# Patient Record
Sex: Female | Born: 1950 | Race: White | Hispanic: No | Marital: Married | Smoking: Never smoker
Health system: Southern US, Community
[De-identification: ages and names within clinical notes are randomized; demographics above are authoritative.]

## PROBLEM LIST (undated history)

## (undated) DIAGNOSIS — I313 Pericardial effusion (noninflammatory): Secondary | ICD-10-CM

## (undated) DIAGNOSIS — R55 Syncope and collapse: Secondary | ICD-10-CM

## (undated) DIAGNOSIS — I519 Heart disease, unspecified: Secondary | ICD-10-CM

## (undated) DIAGNOSIS — M545 Low back pain, unspecified: Secondary | ICD-10-CM

## (undated) DIAGNOSIS — T7840XA Allergy, unspecified, initial encounter: Secondary | ICD-10-CM

## (undated) DIAGNOSIS — E039 Hypothyroidism, unspecified: Secondary | ICD-10-CM

## (undated) DIAGNOSIS — F32A Depression, unspecified: Secondary | ICD-10-CM

## (undated) DIAGNOSIS — J45909 Unspecified asthma, uncomplicated: Secondary | ICD-10-CM

## (undated) DIAGNOSIS — Z8719 Personal history of other diseases of the digestive system: Secondary | ICD-10-CM

## (undated) DIAGNOSIS — C50919 Malignant neoplasm of unspecified site of unspecified female breast: Secondary | ICD-10-CM

## (undated) DIAGNOSIS — M479 Spondylosis, unspecified: Secondary | ICD-10-CM

## (undated) DIAGNOSIS — M25551 Pain in right hip: Secondary | ICD-10-CM

## (undated) DIAGNOSIS — K219 Gastro-esophageal reflux disease without esophagitis: Secondary | ICD-10-CM

## (undated) DIAGNOSIS — J302 Other seasonal allergic rhinitis: Secondary | ICD-10-CM

## (undated) DIAGNOSIS — F329 Major depressive disorder, single episode, unspecified: Secondary | ICD-10-CM

## (undated) DIAGNOSIS — M1990 Unspecified osteoarthritis, unspecified site: Secondary | ICD-10-CM

## (undated) DIAGNOSIS — Z8711 Personal history of peptic ulcer disease: Secondary | ICD-10-CM

## (undated) DIAGNOSIS — I351 Nonrheumatic aortic (valve) insufficiency: Secondary | ICD-10-CM

## (undated) DIAGNOSIS — F419 Anxiety disorder, unspecified: Secondary | ICD-10-CM

## (undated) DIAGNOSIS — I3139 Other pericardial effusion (noninflammatory): Secondary | ICD-10-CM

## (undated) DIAGNOSIS — R011 Cardiac murmur, unspecified: Secondary | ICD-10-CM

## (undated) DIAGNOSIS — R197 Diarrhea, unspecified: Secondary | ICD-10-CM

## (undated) DIAGNOSIS — K760 Fatty (change of) liver, not elsewhere classified: Secondary | ICD-10-CM

## (undated) DIAGNOSIS — F319 Bipolar disorder, unspecified: Secondary | ICD-10-CM

## (undated) DIAGNOSIS — I341 Nonrheumatic mitral (valve) prolapse: Secondary | ICD-10-CM

## (undated) DIAGNOSIS — R0600 Dyspnea, unspecified: Secondary | ICD-10-CM

## (undated) DIAGNOSIS — G5600 Carpal tunnel syndrome, unspecified upper limb: Secondary | ICD-10-CM

## (undated) DIAGNOSIS — I700 Atherosclerosis of aorta: Secondary | ICD-10-CM

## (undated) DIAGNOSIS — Z9889 Other specified postprocedural states: Secondary | ICD-10-CM

## (undated) DIAGNOSIS — I639 Cerebral infarction, unspecified: Secondary | ICD-10-CM

## (undated) DIAGNOSIS — E538 Deficiency of other specified B group vitamins: Secondary | ICD-10-CM

## (undated) DIAGNOSIS — I5189 Other ill-defined heart diseases: Secondary | ICD-10-CM

## (undated) DIAGNOSIS — R112 Nausea with vomiting, unspecified: Secondary | ICD-10-CM

## (undated) DIAGNOSIS — M4800 Spinal stenosis, site unspecified: Secondary | ICD-10-CM

## (undated) DIAGNOSIS — G4700 Insomnia, unspecified: Secondary | ICD-10-CM

## (undated) DIAGNOSIS — G43909 Migraine, unspecified, not intractable, without status migrainosus: Secondary | ICD-10-CM

## (undated) DIAGNOSIS — I10 Essential (primary) hypertension: Secondary | ICD-10-CM

## (undated) DIAGNOSIS — M5416 Radiculopathy, lumbar region: Secondary | ICD-10-CM

## (undated) HISTORY — PX: UPPER GI ENDOSCOPY: SHX6162

## (undated) HISTORY — PX: CHOLECYSTECTOMY: SHX55

## (undated) HISTORY — PX: COLONOSCOPY: SHX174

## (undated) HISTORY — PX: HIP ARTHROPLASTY: SHX981

## (undated) HISTORY — DX: Allergy, unspecified, initial encounter: T78.40XA

## (undated) HISTORY — PX: UPPER GASTROINTESTINAL ENDOSCOPY: SHX188

## (undated) HISTORY — PX: BREAST LUMPECTOMY: SHX2

## (undated) HISTORY — PX: GASTRIC FUNDOPLICATION: SHX226

## (undated) HISTORY — PX: PARTIAL HYSTERECTOMY: SHX80

---

## 2015-12-10 DIAGNOSIS — M1611 Unilateral primary osteoarthritis, right hip: Secondary | ICD-10-CM | POA: Insufficient documentation

## 2015-12-29 DIAGNOSIS — F329 Major depressive disorder, single episode, unspecified: Secondary | ICD-10-CM | POA: Insufficient documentation

## 2015-12-29 DIAGNOSIS — E079 Disorder of thyroid, unspecified: Secondary | ICD-10-CM | POA: Insufficient documentation

## 2015-12-29 DIAGNOSIS — F32A Depression, unspecified: Secondary | ICD-10-CM | POA: Insufficient documentation

## 2015-12-29 DIAGNOSIS — K449 Diaphragmatic hernia without obstruction or gangrene: Secondary | ICD-10-CM | POA: Insufficient documentation

## 2015-12-29 DIAGNOSIS — J45909 Unspecified asthma, uncomplicated: Secondary | ICD-10-CM | POA: Insufficient documentation

## 2015-12-29 DIAGNOSIS — I10 Essential (primary) hypertension: Secondary | ICD-10-CM | POA: Insufficient documentation

## 2017-08-06 ENCOUNTER — Telehealth: Payer: Self-pay | Admitting: Nurse Practitioner

## 2017-08-06 ENCOUNTER — Other Ambulatory Visit: Payer: Self-pay | Admitting: Orthopedic Surgery

## 2017-08-06 DIAGNOSIS — M545 Low back pain: Principal | ICD-10-CM

## 2017-08-06 DIAGNOSIS — G8929 Other chronic pain: Secondary | ICD-10-CM

## 2017-08-06 NOTE — Telephone Encounter (Signed)
Phone call to patient to verify medication list and allergies for myelogram procedure. Pt instructed to stop adderall and tramadol 48 hrs prior to myelogram appointment time. Pt verbalized understanding.

## 2017-08-15 ENCOUNTER — Other Ambulatory Visit: Payer: Self-pay

## 2017-08-15 ENCOUNTER — Inpatient Hospital Stay
Admission: RE | Admit: 2017-08-15 | Discharge: 2017-08-15 | Disposition: A | Discharge status: Home / Self Care | Payer: Self-pay | Source: Ambulatory Visit | Attending: Orthopedic Surgery | Admitting: Orthopedic Surgery

## 2017-08-15 NOTE — Discharge Instructions (Signed)
Myelogram Discharge Instructions  1. Go home and rest quietly for the next 24 hours.  It is important to lie flat for the next 24 hours.  Get up only to go to the restroom.  You may lie in the bed or on a couch on your back, your stomach, your left side or your right side.  You may have one pillow under your head.  You may have pillows between your knees while you are on your side or under your knees while you are on your back.  2. DO NOT drive today.  Recline the seat as far back as it will go, while still wearing your seat belt, on the way home.  3. You may get up to go to the bathroom as needed.  You may sit up for 10 minutes to eat.  You may resume your normal diet and medications unless otherwise indicated.  Drink lots of extra fluids today and tomorrow.  4. The incidence of headache, nausea, or vomiting is about 5% (one in 20 patients).  If you develop a headache, lie flat and drink plenty of fluids until the headache goes away.  Caffeinated beverages may be helpful.  If you develop severe nausea and vomiting or a headache that does not go away with flat bed rest, call [phone].  5. You may resume normal activities after your 24 hours of bed rest is over; however, do not exert yourself strongly or do any heavy lifting tomorrow. If when you get up you have a headache when standing, go back to bed and force fluids for another 24 hours.  6. Call your physician for a follow-up appointment.  The results of your myelogram will be sent directly to your physician by the following day.  7. If you have any questions or if complications develop after you arrive home, please call [phone].  Discharge instructions have been explained to the patient.  The patient, or the person responsible for the patient, fully understands these instructions.  YOU MAY RESTART YOUR TRAMADOL AND ADDERALL TOMORROW 08/16/2017 AT 1:00PM.

## 2017-08-28 ENCOUNTER — Ambulatory Visit
Admission: RE | Admit: 2017-08-28 | Discharge: 2017-08-28 | Disposition: A | Discharge status: Home / Self Care | Payer: Medicare Other | Source: Ambulatory Visit | Attending: Orthopedic Surgery | Admitting: Orthopedic Surgery

## 2017-08-28 DIAGNOSIS — M545 Low back pain: Principal | ICD-10-CM

## 2017-08-28 DIAGNOSIS — G43909 Migraine, unspecified, not intractable, without status migrainosus: Secondary | ICD-10-CM | POA: Insufficient documentation

## 2017-08-28 DIAGNOSIS — C50919 Malignant neoplasm of unspecified site of unspecified female breast: Secondary | ICD-10-CM | POA: Insufficient documentation

## 2017-08-28 DIAGNOSIS — G8929 Other chronic pain: Secondary | ICD-10-CM

## 2017-08-28 DIAGNOSIS — F419 Anxiety disorder, unspecified: Secondary | ICD-10-CM | POA: Insufficient documentation

## 2017-08-28 DIAGNOSIS — J309 Allergic rhinitis, unspecified: Secondary | ICD-10-CM | POA: Insufficient documentation

## 2017-08-28 MED ORDER — DIAZEPAM 5 MG PO TABS
5.0000 mg | ORAL_TABLET | Freq: Once | ORAL | Status: AC
  Administered 2017-08-28: 5 mg via ORAL

## 2017-08-28 MED ORDER — IOPAMIDOL (ISOVUE-M 200) INJECTION 41%
15.0000 mL | Freq: Once | INTRAMUSCULAR | Status: AC
  Administered 2017-08-28: 15 mL via INTRATHECAL

## 2017-08-28 NOTE — Discharge Instructions (Signed)
Myelogram Discharge Instructions  1. Go home and rest quietly for the next 24 hours.  It is important to lie flat for the next 24 hours.  Get up only to go to the restroom.  You may lie in the bed or on a couch on your back, your stomach, your left side or your right side.  You may have one pillow under your head.  You may have pillows between your knees while you are on your side or under your knees while you are on your back.  2. DO NOT drive today.  Recline the seat as far back as it will go, while still wearing your seat belt, on the way home.  3. You may get up to go to the bathroom as needed.  You may sit up for 10 minutes to eat.  You may resume your normal diet and medications unless otherwise indicated.  Drink lots of extra fluids today and tomorrow.  4. The incidence of headache, nausea, or vomiting is about 5% (one in 20 patients).  If you develop a headache, lie flat and drink plenty of fluids until the headache goes away.  Caffeinated beverages may be helpful.  If you develop severe nausea and vomiting or a headache that does not go away with flat bed rest, call [phone].  5. You may resume normal activities after your 24 hours of bed rest is over; however, do not exert yourself strongly or do any heavy lifting tomorrow. If when you get up you have a headache when standing, go back to bed and force fluids for another 24 hours.  6. Call your physician for a follow-up appointment.  The results of your myelogram will be sent directly to your physician by the following day.  7. If you have any questions or if complications develop after you arrive home, please call [phone].  Discharge instructions have been explained to the patient.  The patient, or the person responsible for the patient, fully understands these instructions.  YOU MAY RESTART YOUR ADDERALL AND TRAMADOL TOMORROW 08/29/2017 AT 1:00PM.

## 2017-08-28 NOTE — Progress Notes (Signed)
Patient states she has been off Adderall and Tramadol for at least the past two days.

## 2017-09-07 ENCOUNTER — Encounter (HOSPITAL_COMMUNITY): Payer: Self-pay

## 2017-09-07 NOTE — Patient Instructions (Signed)
Your procedure is scheduled on: Tuesday, September 11, 2017   Surgery Time:  1:30PM-3:30PM   Report to [facility]  Entrance    Report to admitting at 11:30 AM   Call this number if you have problems the morning of surgery [phone]   Do not eat food:After Midnight.   Do NOT smoke after Midnight   May have liquids until 7:30AM day of surgery      CLEAR LIQUID DIET   Foods Allowed                                                                     Foods Excluded  Coffee and tea, regular and decaf                             liquids that you cannot  Plain Jell-O in any flavor                                             see through such as: Fruit ices (not with fruit pulp)                                     milk, soups, orange juice  Iced Popsicles                                    All solid food Carbonated beverages, regular and diet                                    Cranberry, grape and apple juices Sports drinks like Gatorade Lightly seasoned clear broth or consume(fat free) Sugar, honey syrup  Sample Menu Breakfast                                Lunch                                     Supper Cranberry juice                    Beef broth                            Chicken broth Jell-O                                     Grape juice                           Apple juice Coffee or tea  Jell-O                                      Popsicle                                                Coffee or tea                        Coffee or tea   Take these medicines the morning of surgery with A SIP OF WATER: Amlodipine, Gabapentin, Levothyroxine, Omeprazole, Venlafaxine, Eye drops if needed                               You may not have any metal on your body including hair pins, jewelry, and body piercings             Do not wear make-up, lotions, powders, perfumes/cologne, or deodorant             Do not wear nail polish.  Do not shave   48 hours prior to surgery.                Do not bring valuables to the hospital. [place].   Contacts, dentures or bridgework may not be worn into surgery.   Leave suitcase in the car. After surgery it may be brought to your room.   Special Instructions: Bring a copy of your healthcare power of attorney and living will documents         the day of surgery if you haven't scanned them in before.              Please read over the following fact sheets you were given:  [facility] - Preparing for Surgery Before surgery, you can play an important role.  Because skin is not sterile, your skin needs to be as free of germs as possible.  You can reduce the number of germs on your skin by washing with CHG (chlorahexidine gluconate) soap before surgery.  CHG is an antiseptic cleaner which kills germs and bonds with the skin to continue killing germs even after washing. Please DO NOT use if you have an allergy to CHG or antibacterial soaps.  If your skin becomes reddened/irritated stop using the CHG and inform your nurse when you arrive at Short Stay. Do not shave (including legs and underarms) for at least 48 hours prior to the first CHG shower.  You may shave your face/neck.  Please follow these instructions carefully:  1.  Shower with CHG Soap the night before surgery and the  morning of surgery.  2.  If you choose to wash your hair, wash your hair first as usual with your normal  shampoo.  3.  After you shampoo, rinse your hair and body thoroughly to remove the shampoo.                             4.  Use CHG as you would any other liquid soap.  You can apply chg directly to  the skin and wash.  Gently with a scrungie or clean washcloth.  5.  Apply the CHG Soap to your body ONLY FROM THE NECK DOWN.   Do   not use on face/ open                           Wound or open sores. Avoid contact with eyes, ears mouth and   genitals (private parts).                        Wash face,  Genitals (private parts) with your normal soap.             6.  Wash thoroughly, paying special attention to the area where your    surgery  will be performed.  7.  Thoroughly rinse your body with warm water from the neck down.  8.  DO NOT shower/wash with your normal soap after using and rinsing off the CHG Soap.                9.  Pat yourself dry with a clean towel.            10.  Wear clean pajamas.            11.  Place clean sheets on your bed the night of your first shower and do not  sleep with pets. Day of Surgery : Do not apply any lotions/deodorants the morning of surgery.  Please wear clean clothes to the hospital/surgery center.  FAILURE TO FOLLOW THESE INSTRUCTIONS MAY RESULT IN THE CANCELLATION OF YOUR SURGERY  PATIENT SIGNATURE_________________________________  NURSE SIGNATURE__________________________________  ________________________________________________________________________   Jillian Reid  An incentive spirometer is a tool that can help keep your lungs clear and active. This tool measures how well you are filling your lungs with each breath. Taking long deep breaths may help reverse or decrease the chance of developing breathing (pulmonary) problems (especially infection) following:  A long period of time when you are unable to move or be active. BEFORE THE PROCEDURE   If the spirometer includes an indicator to show your best effort, your nurse or respiratory therapist will set it to a desired goal.  If possible, sit up straight or lean slightly forward. Try not to slouch.  Hold the incentive spirometer in an upright position. INSTRUCTIONS FOR USE  1. Sit on the edge of your bed if possible, or sit up as far as you can in bed or on a chair. 2. Hold the incentive spirometer in an upright position. 3. Breathe out normally. 4. Place the mouthpiece in your mouth and seal your lips tightly around it. 5. Breathe in slowly and as  deeply as possible, raising the piston or the ball toward the top of the column. 6. Hold your breath for 3-5 seconds or for as long as possible. Allow the piston or ball to fall to the bottom of the column. 7. Remove the mouthpiece from your mouth and breathe out normally. 8. Rest for a few seconds and repeat Steps 1 through 7 at least 10 times every 1-2 hours when you are awake. Take your time and take a few normal breaths between deep breaths. 9. The spirometer may include an indicator to show your best effort. Use the indicator as a goal to work toward during each repetition. 10. After each set of 10 deep breaths, practice coughing to be sure your lungs are clear.  If you have an incision (the cut made at the time of surgery), support your incision when coughing by placing a pillow or rolled up towels firmly against it. Once you are able to get out of bed, walk around indoors and cough well. You may stop using the incentive spirometer when instructed by your caregiver.  RISKS AND COMPLICATIONS  Take your time so you do not get dizzy or light-headed.  If you are in pain, you may need to take or ask for pain medication before doing incentive spirometry. It is harder to take a deep breath if you are having pain. AFTER USE  Rest and breathe slowly and easily.  It can be helpful to keep track of a log of your progress. Your caregiver can provide you with a simple table to help with this. If you are using the spirometer at home, follow these instructions: [place] IF:   You are having difficultly using the spirometer.  You have trouble using the spirometer as often as instructed.  Your pain medication is not giving enough relief while using the spirometer.  You develop fever of 100.5 F (38.1 C) or higher. SEEK IMMEDIATE MEDICAL CARE IF:   You cough up bloody sputum that had not been present before.  You develop fever of 102 F (38.9 C) or greater.  You develop worsening pain  at or near the incision site. MAKE SURE YOU:   Understand these instructions.  Will watch your condition.  Will get help right away if you are not doing well or get worse. Document Released: 06/19/2006 Document Revised: 05/01/2011 Document Reviewed: 08/20/2006 Exit[employer] Patient Information 2014 [employer], [place].   ________________________________________________________________________  WHAT IS A BLOOD TRANSFUSION? Blood Transfusion Information  A transfusion is the replacement of blood or some of its parts. Blood is made up of multiple cells which provide different functions.  Red blood cells carry oxygen and are used for blood loss replacement.  White blood cells fight against infection.  Platelets control bleeding.  Plasma helps clot blood.  Other blood products are available for specialized needs, such as hemophilia or other clotting disorders. BEFORE THE TRANSFUSION  Who gives blood for transfusions?   Healthy volunteers who are fully evaluated to make sure their blood is safe. This is blood bank blood. Transfusion therapy is the safest it has ever been in the practice of medicine. Before blood is taken from a donor, a complete history is taken to make sure that person has no history of diseases nor engages in risky social behavior (examples are intravenous drug use or sexual activity with multiple partners). The donor's travel history is screened to minimize risk of transmitting infections, such as malaria. The donated blood is tested for signs of infectious diseases, such as HIV and hepatitis. The blood is then tested to be sure it is compatible with you in order to minimize the chance of a transfusion reaction. If you or a relative donates blood, this is often done in anticipation of surgery and is not appropriate for emergency situations. It takes many days to process the donated blood. RISKS AND COMPLICATIONS Although transfusion therapy is very safe and saves many lives, the  main dangers of transfusion include:   Getting an infectious disease.  Developing a transfusion reaction. This is an allergic reaction to something in the blood you were given. Every precaution is taken to prevent this. The decision to have a blood transfusion has been considered carefully by your caregiver before blood is given.  Blood is not given unless the benefits outweigh the risks. AFTER THE TRANSFUSION  Right after receiving a blood transfusion, you will usually feel much better and more energetic. This is especially true if your red blood cells have gotten low (anemic). The transfusion raises the level of the red blood cells which carry oxygen, and this usually causes an energy increase.  The nurse administering the transfusion will monitor you carefully for complications. HOME CARE INSTRUCTIONS  No special instructions are needed after a transfusion. You may find your energy is better. Speak with your caregiver about any limitations on activity for underlying diseases you may have. SEEK MEDICAL CARE IF:   Your condition is not improving after your transfusion.  You develop redness or irritation at the intravenous (IV) site. SEEK IMMEDIATE MEDICAL CARE IF:  Any of the following symptoms occur over the next 12 hours:  Shaking chills.  You have a temperature by mouth above 102 F (38.9 C), not controlled by medicine.  Chest, back, or muscle pain.  People around you feel you are not acting correctly or are confused.  Shortness of breath or difficulty breathing.  Dizziness and fainting.  You get a rash or develop hives.  You have a decrease in urine output.  Your urine turns a dark color or changes to pink, red, or brown. Any of the following symptoms occur over the next 10 days:  You have a temperature by mouth above 102 F (38.9 C), not controlled by medicine.  Shortness of breath.  Weakness after normal activity.  The white part of the eye turns yellow  (jaundice).  You have a decrease in the amount of urine or are urinating less often.  Your urine turns a dark color or changes to pink, red, or brown. Document Released: 02/04/2000 Document Revised: 05/01/2011 Document Reviewed: 09/23/2007 [facility] Patient Information 2014 [place], [place].  _______________________________________________________________________

## 2017-09-07 NOTE — Pre-Procedure Instructions (Signed)
The following are in the chart: Surgical clearance 09/04/17 Dr. Garnette Czech Last office visit note Dr. Garnette Czech 08/07/17 ECHO 08/10/17

## 2017-09-10 ENCOUNTER — Other Ambulatory Visit: Payer: Self-pay

## 2017-09-10 ENCOUNTER — Encounter (HOSPITAL_COMMUNITY)
Admission: RE | Admit: 2017-09-10 | Discharge: 2017-09-10 | Disposition: A | Discharge status: Home / Self Care | Payer: Medicare Other | Source: Ambulatory Visit | Attending: Orthopedic Surgery | Admitting: Orthopedic Surgery

## 2017-09-10 ENCOUNTER — Encounter (HOSPITAL_COMMUNITY): Payer: Self-pay | Admitting: Surgical

## 2017-09-10 ENCOUNTER — Encounter (HOSPITAL_COMMUNITY): Payer: Self-pay

## 2017-09-10 ENCOUNTER — Ambulatory Visit (HOSPITAL_COMMUNITY)
Admission: RE | Admit: 2017-09-10 | Discharge: 2017-09-10 | Disposition: A | Discharge status: Home / Self Care | Payer: Medicare Other | Source: Ambulatory Visit | Attending: Surgical | Admitting: Surgical

## 2017-09-10 DIAGNOSIS — R9431 Abnormal electrocardiogram [ECG] [EKG]: Secondary | ICD-10-CM | POA: Diagnosis not present

## 2017-09-10 DIAGNOSIS — I10 Essential (primary) hypertension: Secondary | ICD-10-CM | POA: Insufficient documentation

## 2017-09-10 DIAGNOSIS — M5136 Other intervertebral disc degeneration, lumbar region: Secondary | ICD-10-CM | POA: Diagnosis not present

## 2017-09-10 DIAGNOSIS — Z01810 Encounter for preprocedural cardiovascular examination: Secondary | ICD-10-CM | POA: Diagnosis present

## 2017-09-10 DIAGNOSIS — K219 Gastro-esophageal reflux disease without esophagitis: Secondary | ICD-10-CM | POA: Diagnosis not present

## 2017-09-10 DIAGNOSIS — Z880 Allergy status to penicillin: Secondary | ICD-10-CM | POA: Diagnosis not present

## 2017-09-10 DIAGNOSIS — E039 Hypothyroidism, unspecified: Secondary | ICD-10-CM | POA: Insufficient documentation

## 2017-09-10 DIAGNOSIS — M545 Low back pain, unspecified: Secondary | ICD-10-CM

## 2017-09-10 DIAGNOSIS — Z79899 Other long term (current) drug therapy: Secondary | ICD-10-CM | POA: Diagnosis not present

## 2017-09-10 DIAGNOSIS — Z885 Allergy status to narcotic agent status: Secondary | ICD-10-CM | POA: Diagnosis not present

## 2017-09-10 DIAGNOSIS — Z79890 Hormone replacement therapy (postmenopausal): Secondary | ICD-10-CM | POA: Diagnosis not present

## 2017-09-10 HISTORY — DX: Other seasonal allergic rhinitis: J30.2

## 2017-09-10 HISTORY — DX: Carpal tunnel syndrome, unspecified upper limb: G56.00

## 2017-09-10 HISTORY — DX: Other pericardial effusion (noninflammatory): I31.39

## 2017-09-10 HISTORY — DX: Personal history of peptic ulcer disease: Z87.11

## 2017-09-10 HISTORY — DX: Personal history of other diseases of the digestive system: Z87.19

## 2017-09-10 HISTORY — DX: Nonrheumatic aortic (valve) insufficiency: I35.1

## 2017-09-10 HISTORY — DX: Hypothyroidism, unspecified: E03.9

## 2017-09-10 HISTORY — DX: Deficiency of other specified B group vitamins: E53.8

## 2017-09-10 HISTORY — DX: Atherosclerosis of aorta: I70.0

## 2017-09-10 HISTORY — DX: Heart disease, unspecified: I51.9

## 2017-09-10 HISTORY — DX: Diarrhea, unspecified: R19.7

## 2017-09-10 HISTORY — DX: Low back pain, unspecified: M54.50

## 2017-09-10 HISTORY — DX: Spinal stenosis, site unspecified: M48.00

## 2017-09-10 HISTORY — DX: Radiculopathy, lumbar region: M54.16

## 2017-09-10 HISTORY — DX: Low back pain: M54.5

## 2017-09-10 HISTORY — DX: Pain in right hip: M25.551

## 2017-09-10 HISTORY — DX: Unspecified osteoarthritis, unspecified site: M19.90

## 2017-09-10 HISTORY — DX: Spondylosis, unspecified: M47.9

## 2017-09-10 HISTORY — DX: Major depressive disorder, single episode, unspecified: F32.9

## 2017-09-10 HISTORY — DX: Nonrheumatic mitral (valve) prolapse: I34.1

## 2017-09-10 HISTORY — DX: Gastro-esophageal reflux disease without esophagitis: K21.9

## 2017-09-10 HISTORY — DX: Syncope and collapse: R55

## 2017-09-10 HISTORY — DX: Malignant neoplasm of unspecified site of unspecified female breast: C50.919

## 2017-09-10 HISTORY — DX: Unspecified asthma, uncomplicated: J45.909

## 2017-09-10 HISTORY — DX: Pericardial effusion (noninflammatory): I31.3

## 2017-09-10 HISTORY — DX: Cardiac murmur, unspecified: R01.1

## 2017-09-10 HISTORY — DX: Depression, unspecified: F32.A

## 2017-09-10 HISTORY — DX: Migraine, unspecified, not intractable, without status migrainosus: G43.909

## 2017-09-10 HISTORY — DX: Other specified postprocedural states: Z98.890

## 2017-09-10 HISTORY — DX: Insomnia, unspecified: G47.00

## 2017-09-10 HISTORY — DX: Bipolar disorder, unspecified: F31.9

## 2017-09-10 HISTORY — DX: Other specified postprocedural states: R11.2

## 2017-09-10 HISTORY — DX: Essential (primary) hypertension: I10

## 2017-09-10 HISTORY — DX: Other ill-defined heart diseases: I51.89

## 2017-09-10 HISTORY — DX: Anxiety disorder, unspecified: F41.9

## 2017-09-10 LAB — CBC WITH DIFFERENTIAL/PLATELET
Basophils Absolute: 0 10*3/uL (ref 0.0–0.1)
Basophils Relative: 0 %
Eosinophils Absolute: 0.1 10*3/uL (ref 0.0–0.7)
Eosinophils Relative: 2 %
HCT: 43.3 % (ref 36.0–46.0)
Hemoglobin: 14.7 g/dL (ref 12.0–15.0)
Lymphocytes Relative: 23 %
Lymphs Abs: 2 10*3/uL (ref 0.7–4.0)
MCH: 30.8 pg (ref 26.0–34.0)
MCHC: 33.9 g/dL (ref 30.0–36.0)
MCV: 90.8 fL (ref 78.0–100.0)
Monocytes Absolute: 0.5 10*3/uL (ref 0.1–1.0)
Monocytes Relative: 6 %
Neutro Abs: 6.2 10*3/uL (ref 1.7–7.7)
Neutrophils Relative %: 69 %
Platelets: 299 10*3/uL (ref 150–400)
RBC: 4.77 MIL/uL (ref 3.87–5.11)
RDW: 14.4 % (ref 11.5–15.5)
WBC: 8.9 10*3/uL (ref 4.0–10.5)

## 2017-09-10 LAB — COMPREHENSIVE METABOLIC PANEL
ALT: 19 U/L (ref 0–44)
AST: 20 U/L (ref 15–41)
Albumin: 3.7 g/dL (ref 3.5–5.0)
Alkaline Phosphatase: 93 U/L (ref 38–126)
Anion gap: 10 (ref 5–15)
BUN: 25 mg/dL — ABNORMAL HIGH (ref 8–23)
CO2: 29 mmol/L (ref 22–32)
Calcium: 9.7 mg/dL (ref 8.9–10.3)
Chloride: 104 mmol/L (ref 98–111)
Creatinine, Ser: 1.15 mg/dL — ABNORMAL HIGH (ref 0.44–1.00)
GFR calc Af Amer: 56 mL/min — ABNORMAL LOW (ref >60)
GFR calc non Af Amer: 48 mL/min — ABNORMAL LOW (ref >60)
Glucose, Bld: 109 mg/dL — ABNORMAL HIGH (ref 70–99)
Potassium: 3.9 mmol/L (ref 3.5–5.1)
Sodium: 143 mmol/L (ref 135–145)
Total Bilirubin: 0.6 mg/dL (ref 0.3–1.2)
Total Protein: 7.2 g/dL (ref 6.5–8.1)

## 2017-09-10 LAB — PROTIME-INR
INR: 1.12
Prothrombin Time: 14.3 seconds (ref 11.4–15.2)

## 2017-09-10 LAB — APTT: aPTT: 27 seconds (ref 24–36)

## 2017-09-10 LAB — SURGICAL PCR SCREEN
MRSA, PCR: NEGATIVE
Staphylococcus aureus: NEGATIVE

## 2017-09-10 LAB — ABO/RH: ABO/RH(D): A POS

## 2017-09-10 MED ORDER — BUPIVACAINE LIPOSOME 1.3 % IJ SUSP
20.0000 mL | Freq: Once | INTRAMUSCULAR | Status: AC
  Filled 2017-09-10: qty 20

## 2017-09-10 NOTE — Pre-Procedure Instructions (Signed)
CMP results 09/10/2017 faxed to Dr. Gladstone Lighter via epic.

## 2017-09-10 NOTE — H&P (Signed)
Jillian Reid is an 67 y.o. female.   Chief Complaint: low back pain HPI: Jillian Reid presented to the office with the chief complaint of low back pain. She reported that she developed low back pain after a fall last October. She has had conservative treatment including ESI and oral corticosteroids. MRI and CT myelogram show disc herniation at L4-L5 on the left with spinal stenosis.   Past Medical History:  Diagnosis Date  . Anxiety   . Aortic atherosclerosis (Jillian Reid)   . Asthma   . Bipolar disorder (Jillian Reid)   . Breast cancer (Jillian Reid)    Left  . Carpal tunnel syndrome   . Depression   . GERD (gastroesophageal reflux disease)   . Grade I diastolic dysfunction   . Heart murmur   . Hypertension   . Hypothyroidism   . Insomnia   . Low back pain   . Lumbar radiculopathy, chronic   . Migraines   . Mild aortic regurgitation   . MVP (mitral valve prolapse)   . OA (osteoarthritis)   . Pericardial effusion    Small  . Right hip pain   . Seasonal allergies   . Spinal stenosis   . Spondylosis   . Syncope   . Vitamin B 12 deficiency     Past Surgical History:  Procedure Laterality Date  . BREAST LUMPECTOMY     Left and reexcision  . CHOLECYSTECTOMY    . HIP ARTHROPLASTY Right   . PARTIAL HYSTERECTOMY      History reviewed. No pertinent family history.  Allergies:  Allergies  Allergen Reactions  . Codeine Nausea And Vomiting  . Penicillins Hives, Itching and Other (See Comments)    Has patient had a PCN reaction causing immediate rash, facial/tongue/throat swelling, SOB or lightheadedness with hypotension: No Has patient had a PCN reaction causing severe rash involving mucus membranes or skin necrosis: No Has patient had a PCN reaction that required hospitalization: No Has patient had a PCN reaction occurring within the last 10 years: No If all of the above answers are "NO", then may proceed with Cephalosporin use.   . Valium [Diazepam] Other (See Comments)    Hallucination      Current Outpatient Medications:  .  amLODipine (NORVASC) 5 MG tablet, Take 5 mg by mouth daily., Disp: , Rfl:  .  amphetamine-dextroamphetamine (ADDERALL) 20 MG tablet, Take 20 mg by mouth 2 (two) times daily as needed (for focus). , Disp: , Rfl:  .  cetirizine (ZYRTEC) 10 MG tablet, Take 10 mg by mouth at bedtime. , Disp: , Rfl:  .  clonazePAM (KLONOPIN) 2 MG tablet, Take 4 mg by mouth at bedtime. , Disp: , Rfl:  .  diclofenac (VOLTAREN) 75 MG EC tablet, Take 75 mg by mouth 2 (two) times daily., Disp: , Rfl:  .  gabapentin (NEURONTIN) 300 MG capsule, Take 300 mg by mouth 3 (three) times daily. , Disp: , Rfl:  .  ketotifen (ALAWAY) 0.025 % ophthalmic solution, Place 2 drops into both eyes daily as needed (for dry eyes).,  .  levothyroxine (SYNTHROID, LEVOTHROID) 137 MCG tablet, Take 137 mcg by mouth daily before breakfast.,  .  losartan-hydrochlorothiazide (HYZAAR) 100-25 MG tablet, Take 1 tablet by mouth daily., Disp: , Rfl:  .  montelukast (SINGULAIR) 10 MG tablet, Take 10 mg by mouth at bedtime., Disp: , Rfl:  .  omeprazole (PRILOSEC) 20 MG capsule, Take 20 mg by mouth daily., Disp: , Rfl:  .  promethazine (PHENERGAN) 25 MG tablet, Take  25 mg by mouth every 6 (six) hours as needed for nausea or vomiting., Disp: , Rfl:  .  Suvorexant (BELSOMRA) 20 MG TABS, Take 20 mg by mouth at bedtime., Disp: , Rfl:  .  tiZANidine (ZANAFLEX) 4 MG tablet, Take 4 mg by mouth 2 (two) times daily as needed for muscle spasms. ,  .  venlafaxine (EFFEXOR) 37.5 MG tablet, Take 37.5 mg by mouth daily., Disp: , Rfl:   Review of Systems  Constitutional: Negative.   HENT: Negative.   Eyes: Negative.   Respiratory: Negative.   Cardiovascular: Negative.   Gastrointestinal: Positive for diarrhea. Negative for abdominal pain, blood in stool, constipation, heartburn, melena, nausea and vomiting.  Genitourinary: Negative.   Musculoskeletal: Positive for back pain, falls, joint pain and myalgias. Negative for neck  pain.  Skin: Negative.   Neurological: Positive for sensory change and weakness. Negative for dizziness, tingling, tremors, speech change, focal weakness, seizures, loss of consciousness and headaches.  Endo/Heme/Allergies: Negative.   Psychiatric/Behavioral: Positive for depression. Negative for hallucinations, memory loss, substance abuse and suicidal ideas. The patient is nervous/anxious. The patient does not have insomnia.    Vitals Temp 98.5 F (36.9 C) 98.5 F (36.9 C)  Pulse Rate 66 66  Resp 16 16  BP: Systolic [id number]  [id number]   BP: Diastolic 86 86  SpO2 96 % 96 %     Physical Exam  Constitutional: She is oriented to person, place, and time. She appears well-developed. No distress.  Obese  HENT:  Head: Normocephalic and atraumatic.  Right Ear: External ear normal.  Left Ear: External ear normal.  Nose: Nose normal.  Mouth/Throat: Oropharynx is clear and moist.  Eyes: Conjunctivae and EOM are normal.  Neck: Normal range of motion. Neck supple.  Cardiovascular: Normal rate, regular rhythm, normal heart sounds and intact distal pulses.  No murmur heard. Respiratory: Effort normal and breath sounds normal. No respiratory distress. She has no wheezes.  GI: Soft. Bowel sounds are normal. She exhibits no distension. There is no tenderness.  Musculoskeletal:       Right hip: Normal.       Left hip: Normal.       Right knee: Normal.       Left knee: Normal.  She has pain with all ranges of motion of her back. She has a positive straight leg raising bilaterally.   Neurological: She is alert and oriented to person, place, and time.  She has weakness of the dorsiflexors of her right foot.  Skin: No rash noted. She is not diaphoretic. No erythema.  Psychiatric: She has a normal mood and affect. Her behavior is normal.     Assessment/Plan Lumbar disc herniation L4-L5 left Lumbar spinal stenosis L4-L5 She needs a lumbar central decompressive laminectomy with  microdiscectomy L4-L5 left. Risks and benefits of the surgery discussed with the patient. Will stay overnight for observation.     Jillian Hangartner LAUREN, PA-C 09/10/2017, 11:04 AM

## 2017-09-11 ENCOUNTER — Ambulatory Visit (HOSPITAL_COMMUNITY): Payer: Medicare Other

## 2017-09-11 ENCOUNTER — Ambulatory Visit (HOSPITAL_COMMUNITY): Payer: Medicare Other | Admitting: Anesthesiology

## 2017-09-11 ENCOUNTER — Encounter (HOSPITAL_COMMUNITY)
Admission: RE | Disposition: A | Discharge status: Home / Self Care | Payer: Self-pay | Source: Ambulatory Visit | Attending: Orthopedic Surgery

## 2017-09-11 ENCOUNTER — Encounter (HOSPITAL_COMMUNITY): Payer: Self-pay | Admitting: Anesthesiology

## 2017-09-11 ENCOUNTER — Observation Stay (HOSPITAL_COMMUNITY)
Admission: RE | Admit: 2017-09-11 | Discharge: 2017-09-12 | Disposition: A | Discharge status: Home / Self Care | Payer: Medicare Other | Source: Ambulatory Visit | Attending: Orthopedic Surgery | Admitting: Orthopedic Surgery

## 2017-09-11 DIAGNOSIS — I080 Rheumatic disorders of both mitral and aortic valves: Secondary | ICD-10-CM | POA: Diagnosis not present

## 2017-09-11 DIAGNOSIS — K219 Gastro-esophageal reflux disease without esophagitis: Secondary | ICD-10-CM | POA: Diagnosis not present

## 2017-09-11 DIAGNOSIS — M479 Spondylosis, unspecified: Secondary | ICD-10-CM | POA: Diagnosis not present

## 2017-09-11 DIAGNOSIS — I700 Atherosclerosis of aorta: Secondary | ICD-10-CM | POA: Diagnosis not present

## 2017-09-11 DIAGNOSIS — M48062 Spinal stenosis, lumbar region with neurogenic claudication: Secondary | ICD-10-CM | POA: Diagnosis present

## 2017-09-11 DIAGNOSIS — Z79899 Other long term (current) drug therapy: Secondary | ICD-10-CM | POA: Insufficient documentation

## 2017-09-11 DIAGNOSIS — F319 Bipolar disorder, unspecified: Secondary | ICD-10-CM | POA: Insufficient documentation

## 2017-09-11 DIAGNOSIS — Z882 Allergy status to sulfonamides status: Secondary | ICD-10-CM | POA: Diagnosis not present

## 2017-09-11 DIAGNOSIS — M1990 Unspecified osteoarthritis, unspecified site: Secondary | ICD-10-CM | POA: Insufficient documentation

## 2017-09-11 DIAGNOSIS — Z885 Allergy status to narcotic agent status: Secondary | ICD-10-CM | POA: Insufficient documentation

## 2017-09-11 DIAGNOSIS — Z90710 Acquired absence of both cervix and uterus: Secondary | ICD-10-CM | POA: Insufficient documentation

## 2017-09-11 DIAGNOSIS — R011 Cardiac murmur, unspecified: Secondary | ICD-10-CM | POA: Insufficient documentation

## 2017-09-11 DIAGNOSIS — F419 Anxiety disorder, unspecified: Secondary | ICD-10-CM | POA: Diagnosis not present

## 2017-09-11 DIAGNOSIS — Z7982 Long term (current) use of aspirin: Secondary | ICD-10-CM | POA: Insufficient documentation

## 2017-09-11 DIAGNOSIS — J45909 Unspecified asthma, uncomplicated: Secondary | ICD-10-CM | POA: Diagnosis not present

## 2017-09-11 DIAGNOSIS — G43909 Migraine, unspecified, not intractable, without status migrainosus: Secondary | ICD-10-CM | POA: Insufficient documentation

## 2017-09-11 DIAGNOSIS — Z9049 Acquired absence of other specified parts of digestive tract: Secondary | ICD-10-CM | POA: Diagnosis not present

## 2017-09-11 DIAGNOSIS — I10 Essential (primary) hypertension: Secondary | ICD-10-CM | POA: Diagnosis not present

## 2017-09-11 DIAGNOSIS — E039 Hypothyroidism, unspecified: Secondary | ICD-10-CM | POA: Insufficient documentation

## 2017-09-11 DIAGNOSIS — M5417 Radiculopathy, lumbosacral region: Secondary | ICD-10-CM | POA: Diagnosis not present

## 2017-09-11 DIAGNOSIS — Z880 Allergy status to penicillin: Secondary | ICD-10-CM | POA: Diagnosis not present

## 2017-09-11 DIAGNOSIS — G5600 Carpal tunnel syndrome, unspecified upper limb: Secondary | ICD-10-CM | POA: Diagnosis not present

## 2017-09-11 DIAGNOSIS — G4700 Insomnia, unspecified: Secondary | ICD-10-CM | POA: Insufficient documentation

## 2017-09-11 DIAGNOSIS — E538 Deficiency of other specified B group vitamins: Secondary | ICD-10-CM | POA: Diagnosis not present

## 2017-09-11 DIAGNOSIS — Z853 Personal history of malignant neoplasm of breast: Secondary | ICD-10-CM | POA: Diagnosis not present

## 2017-09-11 DIAGNOSIS — Z419 Encounter for procedure for purposes other than remedying health state, unspecified: Secondary | ICD-10-CM

## 2017-09-11 DIAGNOSIS — Z96641 Presence of right artificial hip joint: Secondary | ICD-10-CM | POA: Diagnosis not present

## 2017-09-11 DIAGNOSIS — Z8719 Personal history of other diseases of the digestive system: Secondary | ICD-10-CM | POA: Insufficient documentation

## 2017-09-11 HISTORY — PX: LUMBAR LAMINECTOMY/DECOMPRESSION MICRODISCECTOMY: SHX5026

## 2017-09-11 LAB — TYPE AND SCREEN
ABO/RH(D): A POS
Antibody Screen: NEGATIVE

## 2017-09-11 SURGERY — LUMBAR LAMINECTOMY/DECOMPRESSION MICRODISCECTOMY 1 LEVEL
Anesthesia: General

## 2017-09-11 MED ORDER — SODIUM CHLORIDE 0.9 % IV SOLN
INTRAVENOUS | Status: DC | PRN
  Administered 2017-09-11: 500 mL

## 2017-09-11 MED ORDER — GABAPENTIN 300 MG PO CAPS
300.0000 mg | ORAL_CAPSULE | Freq: Three times a day (TID) | ORAL | Status: DC
  Administered 2017-09-11 – 2017-09-12 (×3): 300 mg via ORAL
  Filled 2017-09-11 (×3): qty 1

## 2017-09-11 MED ORDER — THROMBIN 5000 UNITS EX SOLR
OROMUCOSAL | Status: DC | PRN
  Administered 2017-09-11: 14:00:00 via TOPICAL

## 2017-09-11 MED ORDER — POLYETHYLENE GLYCOL 3350 17 G PO PACK: 17.0000 g | PACK | Freq: Every day | ORAL | Status: DC | PRN

## 2017-09-11 MED ORDER — BUPIVACAINE-EPINEPHRINE (PF) 0.5% -1:200000 IJ SOLN
INTRAMUSCULAR | Status: AC
  Filled 2017-09-11: qty 30

## 2017-09-11 MED ORDER — ACETAMINOPHEN 10 MG/ML IV SOLN: 1000.0000 mg | Freq: Once | INTRAVENOUS | Status: DC | PRN

## 2017-09-11 MED ORDER — ONDANSETRON HCL 4 MG/2ML IJ SOLN: 4.0000 mg | Freq: Once | INTRAMUSCULAR | Status: DC | PRN

## 2017-09-11 MED ORDER — LOSARTAN POTASSIUM-HCTZ 100-25 MG PO TABS: 1.0000 | ORAL_TABLET | Freq: Every day | ORAL | Status: DC

## 2017-09-11 MED ORDER — KETOTIFEN FUMARATE 0.025 % OP SOLN
2.0000 [drp] | Freq: Every day | OPHTHALMIC | Status: DC | PRN
  Filled 2017-09-11: qty 5

## 2017-09-11 MED ORDER — SCOPOLAMINE 1 MG/3DAYS TD PT72
1.0000 | MEDICATED_PATCH | TRANSDERMAL | Status: DC
  Administered 2017-09-11: 1.5 mg via TRANSDERMAL

## 2017-09-11 MED ORDER — PHENOL 1.4 % MT LIQD
1.0000 | OROMUCOSAL | Status: DC | PRN
  Filled 2017-09-11: qty 177

## 2017-09-11 MED ORDER — BACITRACIN-NEOMYCIN-POLYMYXIN 400-5-5000 EX OINT
TOPICAL_OINTMENT | CUTANEOUS | Status: AC
  Filled 2017-09-11: qty 1

## 2017-09-11 MED ORDER — SUVOREXANT 20 MG PO TABS: 20.0000 mg | ORAL_TABLET | Freq: Every day | ORAL | Status: DC

## 2017-09-11 MED ORDER — BUPIVACAINE LIPOSOME 1.3 % IJ SUSP
INTRAMUSCULAR | Status: DC | PRN
  Administered 2017-09-11: 20 mL

## 2017-09-11 MED ORDER — VANCOMYCIN HCL IN DEXTROSE 750-5 MG/150ML-% IV SOLN
750.0000 mg | Freq: Once | INTRAVENOUS | Status: AC
  Administered 2017-09-12: 750 mg via INTRAVENOUS
  Filled 2017-09-11: qty 150

## 2017-09-11 MED ORDER — VANCOMYCIN HCL IN DEXTROSE 1-5 GM/200ML-% IV SOLN
1000.0000 mg | INTRAVENOUS | Status: AC
  Administered 2017-09-11: 1000 mg via INTRAVENOUS
  Filled 2017-09-11: qty 200

## 2017-09-11 MED ORDER — ACETAMINOPHEN 650 MG RE SUPP: 650.0000 mg | RECTAL | Status: DC | PRN

## 2017-09-11 MED ORDER — PROPOFOL 10 MG/ML IV BOLUS
INTRAVENOUS | Status: AC
  Filled 2017-09-11: qty 20

## 2017-09-11 MED ORDER — BISACODYL 5 MG PO TBEC: 5.0000 mg | DELAYED_RELEASE_TABLET | Freq: Every day | ORAL | Status: DC | PRN

## 2017-09-11 MED ORDER — FENTANYL CITRATE (PF) 100 MCG/2ML IJ SOLN
INTRAMUSCULAR | Status: AC
  Filled 2017-09-11: qty 2

## 2017-09-11 MED ORDER — HYDROMORPHONE HCL 2 MG PO TABS
2.0000 mg | ORAL_TABLET | ORAL | Status: DC | PRN
  Administered 2017-09-11 – 2017-09-12 (×6): 2 mg via ORAL
  Filled 2017-09-11 (×6): qty 1

## 2017-09-11 MED ORDER — AMPHETAMINE-DEXTROAMPHETAMINE 20 MG PO TABS
20.0000 mg | ORAL_TABLET | Freq: Two times a day (BID) | ORAL | Status: DC | PRN
  Filled 2017-09-11: qty 1

## 2017-09-11 MED ORDER — ONDANSETRON HCL 4 MG PO TABS: 4.0000 mg | ORAL_TABLET | Freq: Four times a day (QID) | ORAL | Status: DC | PRN

## 2017-09-11 MED ORDER — VENLAFAXINE HCL 37.5 MG PO TABS
37.5000 mg | ORAL_TABLET | Freq: Every day | ORAL | Status: DC
  Administered 2017-09-12: 37.5 mg via ORAL
  Filled 2017-09-11: qty 1

## 2017-09-11 MED ORDER — PANTOPRAZOLE SODIUM 40 MG PO TBEC
40.0000 mg | DELAYED_RELEASE_TABLET | Freq: Every day | ORAL | Status: DC
Start: 2017-09-12 — End: 2017-09-12
  Administered 2017-09-12: 40 mg via ORAL
  Filled 2017-09-11: qty 1

## 2017-09-11 MED ORDER — SUGAMMADEX SODIUM 200 MG/2ML IV SOLN
INTRAVENOUS | Status: AC
  Filled 2017-09-11: qty 2

## 2017-09-11 MED ORDER — ACETAMINOPHEN 325 MG PO TABS
650.0000 mg | ORAL_TABLET | ORAL | Status: DC | PRN
  Administered 2017-09-12 (×2): 650 mg via ORAL
  Filled 2017-09-11 (×2): qty 2

## 2017-09-11 MED ORDER — GLYCOPYRROLATE PF 0.2 MG/ML IJ SOSY
PREFILLED_SYRINGE | INTRAMUSCULAR | Status: AC
  Filled 2017-09-11: qty 1

## 2017-09-11 MED ORDER — PHENYLEPHRINE 40 MCG/ML (10ML) SYRINGE FOR IV PUSH (FOR BLOOD PRESSURE SUPPORT)
PREFILLED_SYRINGE | INTRAVENOUS | Status: AC
  Filled 2017-09-11: qty 20

## 2017-09-11 MED ORDER — CHLORHEXIDINE GLUCONATE 4 % EX LIQD: 60.0000 mL | Freq: Once | CUTANEOUS | Status: DC

## 2017-09-11 MED ORDER — SODIUM CHLORIDE 0.9 % IV SOLN
INTRAVENOUS | Status: AC
  Filled 2017-09-11: qty 500000

## 2017-09-11 MED ORDER — BUPIVACAINE-EPINEPHRINE 0.5% -1:200000 IJ SOLN
INTRAMUSCULAR | Status: DC | PRN
  Administered 2017-09-11: 20 mL

## 2017-09-11 MED ORDER — LACTATED RINGERS IV SOLN
INTRAVENOUS | Status: DC
  Administered 2017-09-11 (×2): via INTRAVENOUS

## 2017-09-11 MED ORDER — PHENYLEPHRINE 40 MCG/ML (10ML) SYRINGE FOR IV PUSH (FOR BLOOD PRESSURE SUPPORT)
PREFILLED_SYRINGE | INTRAVENOUS | Status: DC | PRN
  Administered 2017-09-11 (×2): 80 ug via INTRAVENOUS

## 2017-09-11 MED ORDER — SUGAMMADEX SODIUM 200 MG/2ML IV SOLN
INTRAVENOUS | Status: DC | PRN
  Administered 2017-09-11: 200 mg via INTRAVENOUS

## 2017-09-11 MED ORDER — HYDROMORPHONE HCL 1 MG/ML IJ SOLN
INTRAMUSCULAR | Status: AC
  Filled 2017-09-11: qty 1

## 2017-09-11 MED ORDER — EPHEDRINE 5 MG/ML INJ
INTRAVENOUS | Status: AC
  Filled 2017-09-11: qty 10

## 2017-09-11 MED ORDER — PROPOFOL 10 MG/ML IV BOLUS
INTRAVENOUS | Status: DC | PRN
  Administered 2017-09-11: 180 mg via INTRAVENOUS

## 2017-09-11 MED ORDER — ROCURONIUM BROMIDE 10 MG/ML (PF) SYRINGE
PREFILLED_SYRINGE | INTRAVENOUS | Status: DC | PRN
  Administered 2017-09-11: 50 mg via INTRAVENOUS

## 2017-09-11 MED ORDER — HYDROCHLOROTHIAZIDE 25 MG PO TABS
25.0000 mg | ORAL_TABLET | Freq: Every day | ORAL | Status: DC
  Administered 2017-09-12: 25 mg via ORAL
  Filled 2017-09-11 (×3): qty 1

## 2017-09-11 MED ORDER — AMLODIPINE BESYLATE 5 MG PO TABS: 5.0000 mg | ORAL_TABLET | Freq: Every day | ORAL | Status: DC

## 2017-09-11 MED ORDER — LIDOCAINE 2% (20 MG/ML) 5 ML SYRINGE
INTRAMUSCULAR | Status: DC | PRN
  Administered 2017-09-11: 100 mg via INTRAVENOUS

## 2017-09-11 MED ORDER — SCOPOLAMINE 1 MG/3DAYS TD PT72
MEDICATED_PATCH | TRANSDERMAL | Status: AC
  Filled 2017-09-11: qty 1

## 2017-09-11 MED ORDER — DEXAMETHASONE SODIUM PHOSPHATE 10 MG/ML IJ SOLN
INTRAMUSCULAR | Status: DC | PRN
  Administered 2017-09-11: 10 mg via INTRAVENOUS

## 2017-09-11 MED ORDER — EPHEDRINE SULFATE-NACL 50-0.9 MG/10ML-% IV SOSY
PREFILLED_SYRINGE | INTRAVENOUS | Status: DC | PRN
  Administered 2017-09-11: 5 mg via INTRAVENOUS
  Administered 2017-09-11: 10 mg via INTRAVENOUS
  Administered 2017-09-11 (×3): 5 mg via INTRAVENOUS

## 2017-09-11 MED ORDER — LOSARTAN POTASSIUM 50 MG PO TABS
100.0000 mg | ORAL_TABLET | Freq: Every day | ORAL | Status: DC
  Administered 2017-09-11 – 2017-09-12 (×2): 100 mg via ORAL
  Filled 2017-09-11 (×2): qty 2

## 2017-09-11 MED ORDER — AMLODIPINE BESYLATE 5 MG PO TABS
5.0000 mg | ORAL_TABLET | Freq: Every day | ORAL | Status: DC
  Administered 2017-09-11 – 2017-09-12 (×2): 5 mg via ORAL
  Filled 2017-09-11 (×2): qty 1

## 2017-09-11 MED ORDER — THROMBIN (RECOMBINANT) 5000 UNITS EX SOLR
CUTANEOUS | Status: AC
  Filled 2017-09-11: qty 5000

## 2017-09-11 MED ORDER — HYDROMORPHONE HCL 1 MG/ML IJ SOLN
0.2500 mg | INTRAMUSCULAR | Status: DC | PRN
  Administered 2017-09-11 (×2): 0.5 mg via INTRAVENOUS

## 2017-09-11 MED ORDER — ONDANSETRON HCL 4 MG/2ML IJ SOLN
4.0000 mg | Freq: Four times a day (QID) | INTRAMUSCULAR | Status: DC | PRN
  Administered 2017-09-11: 4 mg via INTRAVENOUS
  Filled 2017-09-11: qty 2

## 2017-09-11 MED ORDER — LACTATED RINGERS IV SOLN: INTRAVENOUS | Status: DC

## 2017-09-11 MED ORDER — ONDANSETRON HCL 4 MG/2ML IJ SOLN
INTRAMUSCULAR | Status: DC | PRN
  Administered 2017-09-11: 4 mg via INTRAVENOUS

## 2017-09-11 MED ORDER — MENTHOL 3 MG MT LOZG: 1.0000 | LOZENGE | OROMUCOSAL | Status: DC | PRN

## 2017-09-11 MED ORDER — LEVOTHYROXINE SODIUM 25 MCG PO TABS
137.0000 ug | ORAL_TABLET | Freq: Every day | ORAL | Status: DC
  Administered 2017-09-12: 137 ug via ORAL
  Filled 2017-09-11: qty 1

## 2017-09-11 MED ORDER — BACITRACIN-NEOMYCIN-POLYMYXIN OINTMENT TUBE
TOPICAL_OINTMENT | CUTANEOUS | Status: DC | PRN
  Administered 2017-09-11: 1 via TOPICAL

## 2017-09-11 MED ORDER — GLYCOPYRROLATE PF 0.2 MG/ML IJ SOSY
PREFILLED_SYRINGE | INTRAMUSCULAR | Status: DC | PRN
  Administered 2017-09-11: .2 mg via INTRAVENOUS

## 2017-09-11 MED ORDER — HYDROMORPHONE HCL 1 MG/ML IJ SOLN: 0.5000 mg | INTRAMUSCULAR | Status: DC | PRN

## 2017-09-11 MED ORDER — MEPERIDINE HCL 50 MG/ML IJ SOLN: 6.2500 mg | INTRAMUSCULAR | Status: DC | PRN

## 2017-09-11 MED ORDER — HYDROCODONE-ACETAMINOPHEN 7.5-325 MG PO TABS: 1.0000 | ORAL_TABLET | Freq: Once | ORAL | Status: DC | PRN

## 2017-09-11 MED ORDER — CLONAZEPAM 1 MG PO TABS
4.0000 mg | ORAL_TABLET | Freq: Every day | ORAL | Status: DC
  Administered 2017-09-11: 4 mg via ORAL
  Filled 2017-09-11: qty 4

## 2017-09-11 MED ORDER — FENTANYL CITRATE (PF) 100 MCG/2ML IJ SOLN
INTRAMUSCULAR | Status: DC | PRN
  Administered 2017-09-11: 25 ug via INTRAVENOUS
  Administered 2017-09-11: 100 ug via INTRAVENOUS

## 2017-09-11 MED ORDER — FLEET ENEMA 7-19 GM/118ML RE ENEM: 1.0000 | ENEMA | Freq: Once | RECTAL | Status: DC | PRN

## 2017-09-11 SURGICAL SUPPLY — 40 items
BAG DECANTER FOR FLEXI CONT (MISCELLANEOUS) ×3 IMPLANT
BAG ZIPLOCK 12X15 (MISCELLANEOUS) IMPLANT
CLEANER TIP ELECTROSURG 2X2 (MISCELLANEOUS) ×3 IMPLANT
CLOSURE WOUND 1/2 X4 (GAUZE/BANDAGES/DRESSINGS) ×1
COVER SURGICAL LIGHT HANDLE (MISCELLANEOUS) ×3 IMPLANT
DRAPE MICROSCOPE LEICA (MISCELLANEOUS) ×3 IMPLANT
DRAPE POUCH INSTRU U-SHP 10X18 (DRAPES) ×3 IMPLANT
DRAPE SHEET LG 3/4 BI-LAMINATE (DRAPES) ×3 IMPLANT
DRAPE SURG 17X11 SM STRL (DRAPES) ×3 IMPLANT
DRSG ADAPTIC 3X8 NADH LF (GAUZE/BANDAGES/DRESSINGS) ×3 IMPLANT
DRSG PAD ABDOMINAL 8X10 ST (GAUZE/BANDAGES/DRESSINGS) ×6 IMPLANT
DURAPREP 26ML APPLICATOR (WOUND CARE) ×3 IMPLANT
ELECT BLADE TIP CTD 4 INCH (ELECTRODE) ×3 IMPLANT
ELECT REM PT RETURN 15FT ADLT (MISCELLANEOUS) ×3 IMPLANT
GAUZE SPONGE 4X4 12PLY STRL (GAUZE/BANDAGES/DRESSINGS) ×3 IMPLANT
GLOVE BIOGEL PI IND STRL 8.5 (GLOVE) ×1 IMPLANT
GLOVE BIOGEL PI INDICATOR 8.5 (GLOVE) ×2
GLOVE ECLIPSE 8.0 STRL XLNG CF (GLOVE) ×3 IMPLANT
GOWN STRL REUS W/TWL XL LVL3 (GOWN DISPOSABLE) ×3 IMPLANT
HEMOSTAT SPONGE AVITENE ULTRA (HEMOSTASIS) ×3 IMPLANT
KIT BASIN OR (CUSTOM PROCEDURE TRAY) ×3 IMPLANT
MANIFOLD NEPTUNE II (INSTRUMENTS) ×3 IMPLANT
NEEDLE HYPO 22GX1.5 SAFETY (NEEDLE) ×3 IMPLANT
NEEDLE SPNL 18GX3.5 QUINCKE PK (NEEDLE) ×6 IMPLANT
PACK LAMINECTOMY ORTHO (CUSTOM PROCEDURE TRAY) ×3 IMPLANT
PAD ABD 8X10 STRL (GAUZE/BANDAGES/DRESSINGS) ×3 IMPLANT
PATTIES SURGICAL .5 X.5 (GAUZE/BANDAGES/DRESSINGS) IMPLANT
PATTIES SURGICAL .75X.75 (GAUZE/BANDAGES/DRESSINGS) ×3 IMPLANT
PATTIES SURGICAL 1X1 (DISPOSABLE) ×3 IMPLANT
SPONGE LAP 4X18 RFD (DISPOSABLE) ×3 IMPLANT
SPONGE SURGIFOAM ABS GEL 100 (HEMOSTASIS) ×3 IMPLANT
STAPLER VISISTAT 35W (STAPLE) ×3 IMPLANT
STRIP CLOSURE SKIN 1/2X4 (GAUZE/BANDAGES/DRESSINGS) ×2 IMPLANT
SUT VIC AB 1 CT1 27 (SUTURE) ×4
SUT VIC AB 1 CT1 27XBRD ANTBC (SUTURE) ×2 IMPLANT
SUT VIC AB 2-0 CT1 27 (SUTURE) ×4
SUT VIC AB 2-0 CT1 TAPERPNT 27 (SUTURE) ×2 IMPLANT
SYR 20CC LL (SYRINGE) ×6 IMPLANT
TOWEL OR 17X26 10 PK STRL BLUE (TOWEL DISPOSABLE) ×3 IMPLANT
TOWEL OR NON WOVEN STRL DISP B (DISPOSABLE) ×3 IMPLANT

## 2017-09-11 NOTE — Interval H&P Note (Signed)
History and Physical Interval Note:  09/11/2017 12:15 PM  Jillian Reid  has presented today for surgery, with the diagnosis of L4-L5 Spinal stenosis  The various methods of treatment have been discussed with the patient and family. After consideration of risks, benefits and other options for treatment, the patient has consented to  Procedure(s): Decompressive Lumbar Laminectomy L4-L5 (N/A) as a surgical intervention .  The patient's history has been reviewed, patient examined, no change in status, stable for surgery.  I have reviewed the patient's chart and labs.  Questions were answered to the patient's satisfaction.     [name]

## 2017-09-11 NOTE — Progress Notes (Addendum)
Pharmacy Antibiotic Note  Jillian Reid is a 67 y.o. female admitted on 09/11/2017 with L4-L5 spinal stenosis. Patient is s/p decompressive lumbar laminectomy. Patient received a dose of Vancomycin 1g IV x 1 pre-op. Pharmacy has been consulted for Vancomycin dosing x 1 dose 12 hours post-op for surgical prophylaxis. Confirmed with RN that patient does not have a drain in place.   Plan: Vancomycin 750mg  IV x 1 on 7/24 at [facility]. Pharmacy will sign off. Please re-consult if needed.   Height: 5\' 2"  (157.5 cm) Weight: 160 lb (72.6 kg) IBW/kg (Calculated) : 50.1  Temp (24hrs), Avg:98.3 F (36.8 C), Min:97.8 F (36.6 C), Max:98.7 F (37.1 C)  Recent Labs  Lab 09/10/17 1352  WBC 8.9  CREATININE 1.15*    Estimated Creatinine Clearance: 44.3 mL/min (A) (by C-G formula based on SCr of 1.15 mg/dL (H)).    Allergies  Allergen Reactions  . Codeine Nausea And Vomiting  . Penicillins Hives, Itching and Other (See Comments)    Has patient had a PCN reaction causing immediate rash, facial/tongue/throat swelling, SOB or lightheadedness with hypotension: No Has patient had a PCN reaction causing severe rash involving mucus membranes or skin necrosis: No Has patient had a PCN reaction that required hospitalization: No Has patient had a PCN reaction occurring within the last 10 years: No If all of the above answers are "NO", then may proceed with Cephalosporin use.   . Tizanidine Swelling    Caused Bottom of eye to swell   . Valium [Diazepam] Other (See Comments)    Hallucination  . Oxycodone Rash    Antimicrobials this admission: 7/23 Vancomycin peri-operatively >> 7/24  Microbiology results: none  Thank you for allowing pharmacy to be a part of this patient's care.  Lindell Spar, PharmD, BCPS Pager: [phone] 09/11/2017 5:13 PM

## 2017-09-11 NOTE — Transfer of Care (Signed)
Immediate Anesthesia Transfer of Care Note  Patient: Jillian Reid  Procedure(s) Performed: Decompressive Lumbar Laminectomy L4-L5 (N/A )  Patient Location: PACU  Anesthesia Type:General  Level of Consciousness: sedated  Airway & Oxygen Therapy: Patient Spontanous Breathing and Patient connected to face mask oxygen  Post-op Assessment: Report given to RN and Post -op Vital signs reviewed and stable  Post vital signs: Reviewed and stable  Last Vitals:  Vitals Value Taken Time  BP 146/76 09/11/2017  2:54 PM  Temp    Pulse 95 09/11/2017  2:56 PM  Resp 16 09/11/2017  2:56 PM  SpO2 100 % 09/11/2017  2:56 PM  Vitals shown include unvalidated device data.  Last Pain:  Vitals:   09/11/17 1222  TempSrc: Oral  PainSc:       Patients Stated Pain Goal: 4 (56/72/09 1980)  Complications: No apparent anesthesia complications

## 2017-09-11 NOTE — Op Note (Signed)
NAMEBIRDIA, Jillian Reid MEDICAL RECORD [id number] ACCOUNT [id number] DATE OF BIRTH:[birth year] FACILITY: [facility] LOCATION: [facility] PHYSICIAN:Anniemae Haberkorn Fransico Setters, MD  OPERATIVE REPORT  DATE OF PROCEDURE:  09/11/2017  SURGEON:  [name], MD  ASSISTANT:  Ardeen Jourdain, PA  PREOPERATIVE DIAGNOSES: 1.  Severe spinal stenosis at L4-L5 with severe right leg pain. 2.  Foraminal stenosis involving the L4 and L5 roots bilaterally.  POSTOPERATIVE DIAGNOSES:   1.  Severe spinal stenosis at L4-L5 with severe right leg pain. 2.  Foraminal stenosis involving the L4 and L5 roots bilaterally.  PROCEDURE IN DETAIL:  Under general anesthesia, routine orthopedic prep and draping of the lower back was carried out with the patient in a prone position on the Wilson frame.  A Foley catheter was inserted preop.  At this time, the appropriate time out  was carried out.  I also marked the appropriate site of the right side of the back in the holding area.  She had 1 g of vancomycin IV.  At this time, 2 needles were placed in the back for localization purposes.  X-ray was taken.  An incision then was  made over the L4-L5 interspace and extended proximally and distally in the usual fashion.  I separated the muscle from the lamina and spinous processes bilaterally.  Bleeders were cauterized.  At this time, another x-ray was taken with a Kocher clamp on  the spinous process of L4.  Initially, one was taken with the spinous process of L5.  Following that, I then separated the muscle as I mentioned from the lamina and spinous processes.  The McCullough retractors were inserted.  I then removed the spinous  process of L4.  I went proximally and distally to decompress the area centrally first.  The microscope was brought in, and at this time, we removed the ligamentum flavum with great care taken not to injure the dura.  At that particular time, we then went  out and decompressed the lateral recesses and the foramina.   Great care was taken not to injure the dura.  After we went proximally and distally to make sure we were open in both directions, we went out laterally and decompressed the recesses.  We then  explored the disk at L4-L5.  It was a very hard calcified disk.  The decompression that we did completely freed up the root.  At this time, I felt that the main issue was the bony overgrowth compressing the sac.  We thoroughly irrigated out the area, and  another x-ray was taken prior to closing.  Following that, we irrigated out the wound.  We loosely applied some thrombin-soaked Gelfoam and closed the wound in layers in the usual fashion except I left a small distal and proximal part of the wound open  for drainage purposes.  The subcu was closed with a running locking suture after we closed the muscle.  The staples then were inserted.  We then applied a sterile dressing.  The patient left the operating room in satisfactory condition.  LN/NUANCE  D:09/11/2017 T:09/11/2017 JOB:[id number]

## 2017-09-11 NOTE — Anesthesia Preprocedure Evaluation (Addendum)
Anesthesia Evaluation  Patient identified by MRN, date of birth, ID band Patient awake    Reviewed: Allergy & Precautions, NPO status , Patient's Chart, lab work & pertinent test results  History of Anesthesia Complications (+) PONV  Airway Mallampati: II  TM Distance: >3 FB Neck ROM: Full    Dental no notable dental hx. (+) Teeth Intact   Pulmonary neg pulmonary ROS,    Pulmonary exam normal breath sounds clear to auscultation       Cardiovascular hypertension, Pt. on medications Normal cardiovascular examPacemaker: \  Rhythm:Regular Rate:Normal     Neuro/Psych Anxiety negative neurological ROS     GI/Hepatic negative GI ROS, Neg liver ROS, GERD  ,  Endo/Other    Renal/GU      Musculoskeletal   Abdominal   Peds  Hematology   Anesthesia Other Findings   Reproductive/Obstetrics                            Lab Results  Component Value Date   CREATININE 1.15 (H) 09/10/2017   BUN 25 (H) 09/10/2017   NA 143 09/10/2017   K 3.9 09/10/2017   CL 104 09/10/2017   CO2 29 09/10/2017    Lab Results  Component Value Date   WBC 8.9 09/10/2017   HGB 14.7 09/10/2017   HCT 43.3 09/10/2017   MCV 90.8 09/10/2017   PLT 299 09/10/2017    Anesthesia Physical Anesthesia Plan  ASA: II  Anesthesia Plan:    Post-op Pain Management:    Induction:   PONV Risk Score and Plan:   Airway Management Planned:   Additional Equipment:   Intra-op Plan:   Post-operative Plan:   Informed Consent:   Plan Discussed with:   Anesthesia Plan Comments:         Anesthesia Quick Evaluation

## 2017-09-11 NOTE — Anesthesia Procedure Notes (Signed)
Procedure Name: Intubation Date/Time: 09/11/2017 12:52 PM Performed by: Lavina Hamman, CRNA Pre-anesthesia Checklist: Patient identified, Emergency Drugs available, Suction available, Patient being monitored and Timeout performed Patient Re-evaluated:Patient Re-evaluated prior to induction Oxygen Delivery Method: Circle system utilized Preoxygenation: Pre-oxygenation with 100% oxygen Induction Type: IV induction Ventilation: Mask ventilation without difficulty Laryngoscope Size: Mac and 3 Grade View: Grade I Tube type: Oral Tube size: 7.0 mm Number of attempts: 1 Airway Equipment and Method: Stylet Placement Confirmation: ETT inserted through vocal cords under direct vision,  positive ETCO2,  CO2 detector and breath sounds checked- equal and bilateral Secured at: 20 cm Tube secured with: Tape Dental Injury: Teeth and Oropharynx as per pre-operative assessment  Comments: ATOI by Ems Student.

## 2017-09-11 NOTE — Brief Op Note (Signed)
09/11/2017  2:48 PM  PATIENT:  Jillian Reid  67 y.o. female  PRE-OPERATIVE DIAGNOSIS:  L4-L5 Spinal stenosis and foraminal Stenosis involving the L-4  And L-5 nerve roots bilaterally.  POST-OPERATIVE DIAGNOSIS:  Same as Pre-Op  PROCEDURE:  Procedure(s): Decompressive Lumbar Laminectomy L4-L5 (N/A) for severe Spinal Stenosis and Foraminotomies for the L-4 and L-5 Nerve roots Bilaterally.  SURGEON:  Surgeon(s) and Role:    * [name], MD - Primary  PHYSICIAN ASSISTANT: Ardeen Jourdain PA  ASSISTANTS:Amber [place] PA   ANESTHESIA:   general  EBL:  50 mL   BLOOD ADMINISTERED:none  DRAINS: none   LOCAL MEDICATIONS USED:  MARCAINE 20cc of 0.50% with Epinephrine  at the start of the case and 20cc of Exparel at the end of the case.    SPECIMEN:  No Specimen  DISPOSITION OF SPECIMEN:  N/A  COUNTS:  YES  TOURNIQUET:  * No tourniquets in log *  DICTATION: .Other Dictation: Dictation Number [id number]  PLAN OF CARE: Admit for overnight observation  PATIENT DISPOSITION:  PACU - hemodynamically stable.   Delay start of Pharmacological VTE agent (>24hrs) due to surgical blood loss or risk of bleeding: yes

## 2017-09-11 NOTE — Anesthesia Postprocedure Evaluation (Signed)
Anesthesia Post Note  Patient: Jillian Reid  Procedure(s) Performed: Decompressive Lumbar Laminectomy L4-L5 (N/A )     Patient location during evaluation: PACU Anesthesia Type: General Level of consciousness: awake and alert Pain management: pain level controlled Vital Signs Assessment: post-procedure vital signs reviewed and stable Respiratory status: spontaneous breathing, nonlabored ventilation, respiratory function stable and patient connected to nasal cannula oxygen Cardiovascular status: blood pressure returned to baseline and stable Postop Assessment: no apparent nausea or vomiting Anesthetic complications: no    Last Vitals:  Vitals:   09/11/17 1617 09/11/17 1715  BP: (!) 141/79 (!) 151/78  Pulse: 89 87  Resp: 16 14  Temp: 36.6 C 36.7 C  SpO2: 98% 96%    Last Pain:  Vitals:   09/11/17 1720  TempSrc:   PainSc: 6                  Barnet Glasgow

## 2017-09-12 ENCOUNTER — Encounter (HOSPITAL_COMMUNITY): Payer: Self-pay | Admitting: Orthopedic Surgery

## 2017-09-12 DIAGNOSIS — M48062 Spinal stenosis, lumbar region with neurogenic claudication: Secondary | ICD-10-CM | POA: Diagnosis not present

## 2017-09-12 MED ORDER — HYDROMORPHONE HCL 2 MG PO TABS: 2.0000 mg | ORAL_TABLET | ORAL | 0 refills | Status: DC | PRN

## 2017-09-12 MED ORDER — ASPIRIN EC 325 MG PO TBEC: 325.0000 mg | DELAYED_RELEASE_TABLET | Freq: Every day | ORAL | 0 refills | Status: DC

## 2017-09-12 NOTE — Addendum Note (Signed)
Addendum  created 09/12/17 0719 by Lollie Sails, CRNA   Charge Capture section accepted

## 2017-09-12 NOTE — Progress Notes (Signed)
Occupational Therapy Treatment Patient Details Name: Jillian Reid MRN: [id number] DOB: January 02, 1951 Today's Date: 09/12/2017    History of present illness 67 yo female s/p laminetomy L4-L5 7/23. Hx of hip surgery.    OT comments  Pt doing well and feels ready to Dc home with family to [place]  Follow Up Recommendations  No OT follow up    Equipment Recommendations  None recommended by OT       Precautions / Restrictions Precautions Precautions: Back Precaution Comments: Reviewed back precautions and logroll technique Restrictions Weight Bearing Restrictions: No       Mobility Bed Mobility Overal bed mobility: Needs Assistance Bed Mobility: Supine to Sit Rolling: Supervision Sidelying to sit: Supervision       General bed mobility comments: Pt familiar with logroll. She performed task well.   Transfers Overall transfer level: Needs assistance Equipment used: None;Rolling walker (2 wheeled) Transfers: Sit to/from [facility] Sit to Stand: Supervision Stand pivot transfers: Supervision       General transfer comment: close guard for safety.     Balance Overall balance assessment: Mild deficits observed, not formally tested           Standing balance-Leahy Scale: Fair Standing balance comment: Pt walked to bathroom without using device. Noted to "furniture walk".                            ADL either performed or assessed with clinical judgement   ADL Overall ADL's : Needs assistance/impaired Eating/Feeding: Set up;Sitting   Grooming: Set up;Sitting   Upper Body Bathing: Sitting;Set up   Lower Body Bathing: Min guard;Cueing for sequencing;Cueing for safety;Cueing for compensatory techniques;Cueing for back precautions   Upper Body Dressing : Set up;Sitting   Lower Body Dressing: Minimal assistance;Cueing for safety;Cueing for sequencing;Cueing for compensatory techniques;Cueing for back precautions   Toilet Transfer:  Supervision/safety;Cueing for sequencing;Cueing for safety;Comfort height toilet   Toileting- Clothing Manipulation and Hygiene: Supervision/safety;Sit to/from stand;Cueing for sequencing;Cueing for safety   Tub/ Shower Transfer: Supervision/safety;Ambulation;Cueing for safety;Cueing for sequencing   Functional mobility during ADLs: Min guard       Vision Patient Visual Report: No change from baseline            Cognition Arousal/Alertness: Awake/alert Behavior During Therapy: WFL for tasks assessed/performed Overall Cognitive Status: Within Functional Limits for tasks assessed                                         Pertinent Vitals/ Pain       Pain Assessment: Faces Pain Score: 4  Faces Pain Scale: Hurts little more Pain Location: back Pain Descriptors / Indicators: Sore;Aching;Discomfort Pain Intervention(s): Limited activity within patient's tolerance;Repositioned  Home Living Family/patient expects to be discharged to:: Private residence Living Arrangements: Spouse/significant other Available Help at Discharge: Family(daughter plans to assist as needed) Type of Home: House Home Access: Level entry     Home Layout: Able to live on main level with bedroom/bathroom;Laundry or work area in [place]: Environmental consultant - 2 wheels          Prior Functioning/Environment Level of Independence: Independent            Frequency  Min 2X/week        Progress Toward Goals  OT Goals(current goals can now be found in the care plan section)     Acute Rehab OT Goals Patient Stated Goal: home regain PLOF.  OT Goal Formulation: With patient Time For Goal Achievement: 09/19/17 ADL Goals Pt Will Perform Lower Body Bathing: with modified independence;sit to/from stand Pt Will Perform Lower Body Dressing: with modified independence;sit to/from stand Pt Will Transfer to Toilet: with modified independence;ambulating Pt Will  Perform Toileting - Clothing Manipulation and hygiene: with modified independence;sit to/from stand Pt Will Perform Tub/Shower Transfer: with modified independence;shower seat  Plan         AM-PAC PT "6 Clicks" Daily Activity     Outcome Measure   Help from another person eating meals?: None Help from another person taking care of personal grooming?: A Little Help from another person toileting, which includes using toliet, bedpan, or urinal?: A Little Help from another person bathing (including washing, rinsing, drying)?: A Little Help from another person to put on and taking off regular upper body clothing?: A Little Help from another person to put on and taking off regular lower body clothing?: A Lot 6 Click Score: 18    End of Session    OT Visit Diagnosis: Unsteadiness on feet (R26.81);Muscle weakness (generalized) (M62.81);Pain   Activity Tolerance Patient tolerated treatment well   Patient Left in bed;with call bell/phone within reach   Nurse Communication Mobility status        Time: 8101-7510 OT Time Calculation (min): 15 min  Charges: OT General Charges $OT Visit: 1 Visit OT Evaluation $OT Eval Moderate Complexity: 1 Mod OT Treatments $Self Care/Home Management : 8-22 mins  [name], [name]   [name] 09/12/2017, 1:42 PM

## 2017-09-12 NOTE — Evaluation (Signed)
Physical Therapy Evaluation Patient Details Name: Jillian Reid MRN: [id number] DOB: 1950/02/21 Today's Date: 09/12/2017   History of Present Illness  67 yo female s/p laminetomy L4-L5 7/23. Hx of hip surgery.   Clinical Impression  On eval, pt was Sup-Min guard assist for mobility. She walked ~200 feet with a RW. Mild pain with activity. Reviewed back precautions and car transfer technique. Recommend RW use for safe ambulation. Okay to d/c from PT standpoint-made RN aware.      Follow Up Recommendations No PT follow up;Supervision/Assistance - 24 hour    Equipment Recommendations  None recommended by PT(pt stated she thinks she has a RW at home)    Recommendations for Other Services       Precautions / Restrictions Precautions Precautions: Back Precaution Comments: Reviewed back precautions and logroll technique Restrictions Weight Bearing Restrictions: No      Mobility  Bed Mobility Overal bed mobility: Needs Assistance Bed Mobility: Rolling;Sidelying to Sit Rolling: Supervision Sidelying to sit: Supervision       General bed mobility comments: Pt familiar with logroll. She performed task well.   Transfers Overall transfer level: Needs assistance Equipment used: None Transfers: Sit to/from Stand Sit to Stand: Min guard         General transfer comment: close guard for safety.   Ambulation/Gait Ambulation/Gait assistance: Min guard Gait Distance (Feet): 200 Feet Assistive device: Rolling walker (2 wheeled) Gait Pattern/deviations: Step-through pattern     General Gait Details: close guard for safety. cues for adherence to back precautions.  Stairs            Wheelchair Mobility    Modified Rankin (Stroke Patients Only)       Balance Overall balance assessment: Needs assistance           Standing balance-Leahy Scale: Fair Standing balance comment: Pt walked to bathroom without using device. Noted to "furniture walk".                               Pertinent Vitals/Pain Pain Assessment: Faces Faces Pain Scale: Hurts little more Pain Location: back Pain Descriptors / Indicators: Sore;Aching;Discomfort Pain Intervention(s): Monitored during session;Repositioned    Home Living Family/patient expects to be discharged to:: Private residence Living Arrangements: Spouse/significant other Available Help at Discharge: Family(daughter plans to assist as needed) Type of Home: House Home Access: Level entry     Home Layout: Able to live on main level with bedroom/bathroom;Laundry or work area in [place]: Environmental consultant - 2 wheels      Prior Function Level of Independence: Independent               Hand Dominance        Extremity/Trunk Assessment   Upper Extremity Assessment Upper Extremity Assessment: Defer to OT evaluation    Lower Extremity Assessment Lower Extremity Assessment: Generalized weakness    Cervical / Trunk Assessment Cervical / Trunk Assessment: Normal  Communication   Communication: No difficulties  Cognition Arousal/Alertness: Awake/alert Behavior During Therapy: WFL for tasks assessed/performed Overall Cognitive Status: Within Functional Limits for tasks assessed                                        General Comments      Exercises     Assessment/Plan    PT Assessment Patient needs continued PT services  PT Problem List Decreased mobility;Pain;Decreased knowledge of precautions;Decreased balance;Decreased knowledge of use of DME       PT Treatment Interventions DME instruction;Therapeutic activities;Therapeutic exercise;Patient/family education;Gait training;Functional mobility training;Balance training    PT Goals (Current goals can be found in the Care Plan section)  Acute Rehab PT Goals Patient Stated Goal: home regain PLOF.  PT Goal Formulation: With patient/family Time For Goal Achievement: 09/26/17 Potential to Achieve Goals:  Good    Frequency Min 5X/week   Barriers to discharge        Co-evaluation               AM-PAC PT "6 Clicks" Daily Activity  Outcome Measure Difficulty turning over in bed (including adjusting bedclothes, sheets and blankets)?: A Little Difficulty moving from lying on back to sitting on the side of the bed? : A Little Difficulty sitting down on and standing up from a chair with arms (e.g., wheelchair, bedside commode, etc,.)?: A Little Help needed moving to and from a bed to chair (including a wheelchair)?: A Little Help needed walking in hospital room?: A Little Help needed climbing 3-5 steps with a railing? : A Lot 6 Click Score: 17    End of Session   Activity Tolerance: Patient tolerated treatment well Patient left: in chair;with call bell/phone within reach;with family/visitor present   PT Visit Diagnosis: Pain;Difficulty in walking, not elsewhere classified (R26.2) Pain - part of body: (back)    Time: 5537-4827 PT Time Calculation (min) (ACUTE ONLY): 22 min   Charges:   PT Evaluation $PT Eval Low Complexity: 1 Low     PT G Codes:         Weston Anna, MPT Pager: [phone]

## 2017-09-12 NOTE — Discharge Summary (Signed)
Physician Discharge Summary   Patient ID: Jillian Reid MRN: [id number] DOB/AGE: 24-Oct-1950 67 y.o.  Admit date: 09/11/2017 Discharge date: 09/12/2017  Primary Diagnosis:   L4-L5 Spinal stenosis  Admission Diagnoses:  Past Medical History:  Diagnosis Date  . Anxiety   . Aortic atherosclerosis ([place])   . Asthma   . Bipolar disorder ([place])   . Breast cancer ([place])    Left  . Carpal tunnel syndrome    Bilateral  . Depression   . Diarrhea   . GERD (gastroesophageal reflux disease)   . Grade I diastolic dysfunction   . Heart murmur   . History of stomach ulcers   . Hypertension   . Hypothyroidism   . Insomnia   . Low back pain   . Lumbar radiculopathy, chronic   . Migraines   . Mild aortic regurgitation   . MVP (mitral valve prolapse)   . OA (osteoarthritis)   . Pericardial effusion    Small  . PONV (postoperative nausea and vomiting)   . Right hip pain   . Seasonal allergies   . Spinal stenosis   . Spondylosis   . Syncope   . Vitamin B 12 deficiency    Discharge Diagnoses:   Active Problems:   Spinal stenosis, lumbar region with neurogenic claudication  Procedure:  Procedure(s) (LRB): Decompressive Lumbar Laminectomy L4-L5 (N/A)   Consults: None  HPI:  Back Pain Jillian Reid presented to the office with the chief complaint of low back pain. She reported that she developed low back pain after a fall last October. She has had conservative treatment including ESI and oral corticosteroids. MRI and CT myelogram show disc herniation at L4-L5 on the left with spinal stenosis.      Laboratory Data: Hospital Outpatient Visit on 09/10/2017  Component Date Value Ref Range Status  . ABO/RH(D) 09/10/2017    Final                   Value:A POS Performed at [facility], [address] [place], [place] [address]    Recent Labs    09/10/17 1352  HGB 14.7   Recent Labs    09/10/17 1352  WBC 8.9  RBC 4.77  HCT 43.3  PLT 299   Recent Labs   09/10/17 1352  NA 143  K 3.9  CL 104  CO2 29  BUN 25*  CREATININE 1.15*  GLUCOSE 109*  CALCIUM 9.7   Recent Labs    09/10/17 1352  INR 1.12    X-Rays:Dg Lumbar Spine 2-3 Views  Result Date: 09/10/2017 CLINICAL DATA:  Please number vertebrae on AP and Lateral images for surgeon. Back surgery scheduled for tomorrow 09/11/2017. Hx of lower back pain that radiates down both legs. EXAM: LUMBAR SPINE - 2-3 VIEW COMPARISON:  CT 08/28/2017 FINDINGS: 5 non rib-bearing lumbar segments, numbered as on prior study. Negative for fracture. Normal alignment. Small anterior endplate spurs at all lumbar levels. Aortic Atherosclerosis (ICD10-170.0). Cholecystectomy clips. Right hip arthroplasty hardware partially visualized. IMPRESSION: 1. Usual degenerative spurring.  No acute findings. Electronically Signed   By: Lucrezia Europe M.D.   On: 09/10/2017 16:23   Ct Lumbar Spine W Contrast  Result Date: 08/28/2017 CLINICAL DATA:  Low back pain.  Suspected spinal stenosis. EXAM: LUMBAR MYELOGRAM FLUOROSCOPY TIME:  dictate in minutes and seconds PROCEDURE: After thorough discussion of risks and benefits of the procedure including bleeding, infection, injury to nerves, blood vessels, adjacent structures as well as headache and CSF leak, written  and oral informed consent was obtained. Consent was obtained by Dr. Rolla Flatten. Time out form was completed. Patient was positioned prone on the fluoroscopy table. Local anesthesia was provided with 1% lidocaine without epinephrine after prepped and draped in the usual sterile fashion. Puncture was performed at L3-L4 using a 3 1/2 inch 22-gauge spinal needle via midline approach. Using a single pass through the dura, the needle was placed within the thecal sac, with return of clear CSF. 15 mL of Isovue M-200 was injected into the thecal sac, with normal opacification of the nerve roots and cauda equina consistent with free flow within the subarachnoid space. I personally performed  the lumbar puncture and administered the intrathecal contrast. I also personally supervised acquisition of the myelogram images. TECHNIQUE: Contiguous axial images were obtained through the Lumbar spine after the intrathecal infusion of infusion. Coronal and sagittal reconstructions were obtained of the axial image sets. COMPARISON:  None FINDINGS: LUMBAR MYELOGRAM FINDINGS: Good opacification lumbar subarachnoid space. Severe stenosis at L4-5, waist like narrowing, ventral defect, BILATERAL L5 nerve root cut off. Anatomic alignment with patient prone for myelography. Shallow ventral defects also noted at L3-4, L2-3, and L1-2. With patient standing, on AP view, stenosis at L4-5 is increased. This is likely due to ligamentum flavum infolding. Central protrusion is moderately more prominent. There is shallow extradural defects at L3-4 bilaterally with patient standing. There is no anterolisthesis however. Ventral defects at L1-2, L2-3, and L3-4 mildly increased. The patient develops 3 mm of anterolisthesis at L5-S1 in standing neutral. There is also 3 mm anterolisthesis L5-S1 in flexion. In extension, the anterolisthesis decreases to 1 mm. No anterolisthesis/dynamic instability at the L4-5 level. RIGHT total hip arthroplasty. CT LUMBAR MYELOGRAM FINDINGS: Segmentation: Normal. Alignment: L5-S1 demonstrates only minimal 1 mm anterolisthesis with patient recumbent for CT. There is also 1 mm retrolisthesis L1-2. Vertebrae: No worrisome osseous lesion. Conus medullaris: Normal in size and location. Paraspinal tissues: No evidence for hydronephrosis or paravertebral mass. Cholecystectomy. Aortic atherosclerosis. Disc levels: L1-L2:  Trace retrolisthesis.  Annular bulge.  No impingement. L2-L3:  Shallow protrusion, calcified annulus.  No impingement. L3-L4:  Annular bulge.  Calcified annulus.  No impingement. L4-L5: Moderate multifactorial spinal stenosis related to central protrusion, facet arthropathy, and ligamentum  flavum hypertrophy. Calcified annulus, greater on the LEFT. BILATERAL L4 and L5 neural impingement. L5-S1: 1 mm anterolisthesis. Shallow central protrusion. Facet arthropathy. No definite impingement. IMPRESSION: LUMBAR MYELOGRAM IMPRESSION: Moderate multifactorial spinal stenosis L4-5. Mild dynamic instability at L5-S1, up to 3 mm with patient standing in flexion. CT LUMBAR MYELOGRAM IMPRESSION: Moderate spinal stenosis at L4-5 relates to central protrusion, and posterior element hypertrophy. BILATERAL L4 and L5 neural impingement. Facet mediated anterolisthesis at L5-S1 measures only 1 mm with patient recumbent. No impingement. Mild multilevel spondylosis, without significant neural impingement or stenosis L1-2 through L3-4. Aortic Atherosclerosis (ICD10-I70.0). Electronically Signed   By: Staci Righter M.D.   On: 08/28/2017 15:36   Dg Spine Portable 1 View  Result Date: 09/11/2017 CLINICAL DATA:  67 year old female for lumbar surgery. Subsequent encounter. EXAM: PORTABLE SPINE - 1 VIEW COMPARISON:  Intraoperative lateral views of the lumbar spine performed earlier most recent 09/11/2017 1:18 p.m. Postmyelogram CT 08/28/2017. FINDINGS: Metallic probe posterior to the lower L4 level overlying the inferior aspect of the L4 facet. IMPRESSION: Metallic probe posterior to the lower L4 level overlying the inferior aspect of the L4 facet. Electronically Signed   By: Genia Del M.D.   On: 09/11/2017 13:55   Dg Spine Portable  1 View  Result Date: 09/11/2017 CLINICAL DATA:  Lumbar spine surgery. EXAM: PORTABLE SPINE - 1 VIEW COMPARISON:  Lumbar spine radiograph September 11, 2017 at 1308 hours. FINDINGS: Single lateral intraoperative radiograph of the lumbar spine. Surgical instrument projects at the spinous process of L4. Lumbar vertebral bodies intact. Hip arthroplasty. IMPRESSION: Intraoperative localization: Surgical instrument projecting at posterior elements of L4. Electronically Signed   By: Elon Alas  M.D.   On: 09/11/2017 13:40   Dg Spine Portable 1 View  Result Date: 09/11/2017 CLINICAL DATA:  Patient undergoing lumbar surgery. Intraoperative localization film. EXAM: PORTABLE SPINE - 1 VIEW COMPARISON:  Single lateral view of the lumbar spine earlier today and two views lumbar spine 09/10/2017. Postmyelogram CT scan 08/28/2017. FINDINGS: Single lateral view of the lumbar spine demonstrates a metallic probe directed toward the inferior endplate of L5. IMPRESSION: As above. Electronically Signed   By: Inge Rise M.D.   On: 09/11/2017 13:31   Dg Spine Portable 1 View  Result Date: 09/11/2017 CLINICAL DATA:  Intraoperative lumbar localization EXAM: PORTABLE SPINE - 1 VIEW COMPARISON:  Lumbar Spine radiographs-08/21/2017 FINDINGS: Single spot intraoperative lateral projection radiograph the lower lumbar spine is provided for review. Spinal labeling is in keeping with preprocedural lumbar spine radiographs. Marking needles are seen posterior to the L5 and S1 vertebral bodies. IMPRESSION: Intraoperative localization of L5 and S1. Electronically Signed   By: Sandi Mariscal M.D.   On: 09/11/2017 13:27   Dg Myelography Lumbar Inj Lumbosacral  Result Date: 08/28/2017 CLINICAL DATA:  Low back pain.  Suspected spinal stenosis. EXAM: LUMBAR MYELOGRAM FLUOROSCOPY TIME:  dictate in minutes and seconds PROCEDURE: After thorough discussion of risks and benefits of the procedure including bleeding, infection, injury to nerves, blood vessels, adjacent structures as well as headache and CSF leak, written and oral informed consent was obtained. Consent was obtained by Dr. Rolla Flatten. Time out form was completed. Patient was positioned prone on the fluoroscopy table. Local anesthesia was provided with 1% lidocaine without epinephrine after prepped and draped in the usual sterile fashion. Puncture was performed at L3-L4 using a 3 1/2 inch 22-gauge spinal needle via midline approach. Using a single pass through the dura,  the needle was placed within the thecal sac, with return of clear CSF. 15 mL of Isovue M-200 was injected into the thecal sac, with normal opacification of the nerve roots and cauda equina consistent with free flow within the subarachnoid space. I personally performed the lumbar puncture and administered the intrathecal contrast. I also personally supervised acquisition of the myelogram images. TECHNIQUE: Contiguous axial images were obtained through the Lumbar spine after the intrathecal infusion of infusion. Coronal and sagittal reconstructions were obtained of the axial image sets. COMPARISON:  None FINDINGS: LUMBAR MYELOGRAM FINDINGS: Good opacification lumbar subarachnoid space. Severe stenosis at L4-5, waist like narrowing, ventral defect, BILATERAL L5 nerve root cut off. Anatomic alignment with patient prone for myelography. Shallow ventral defects also noted at L3-4, L2-3, and L1-2. With patient standing, on AP view, stenosis at L4-5 is increased. This is likely due to ligamentum flavum infolding. Central protrusion is moderately more prominent. There is shallow extradural defects at L3-4 bilaterally with patient standing. There is no anterolisthesis however. Ventral defects at L1-2, L2-3, and L3-4 mildly increased. The patient develops 3 mm of anterolisthesis at L5-S1 in standing neutral. There is also 3 mm anterolisthesis L5-S1 in flexion. In extension, the anterolisthesis decreases to 1 mm. No anterolisthesis/dynamic instability at the L4-5 level. RIGHT total hip  arthroplasty. CT LUMBAR MYELOGRAM FINDINGS: Segmentation: Normal. Alignment: L5-S1 demonstrates only minimal 1 mm anterolisthesis with patient recumbent for CT. There is also 1 mm retrolisthesis L1-2. Vertebrae: No worrisome osseous lesion. Conus medullaris: Normal in size and location. Paraspinal tissues: No evidence for hydronephrosis or paravertebral mass. Cholecystectomy. Aortic atherosclerosis. Disc levels: L1-L2:  Trace retrolisthesis.   Annular bulge.  No impingement. L2-L3:  Shallow protrusion, calcified annulus.  No impingement. L3-L4:  Annular bulge.  Calcified annulus.  No impingement. L4-L5: Moderate multifactorial spinal stenosis related to central protrusion, facet arthropathy, and ligamentum flavum hypertrophy. Calcified annulus, greater on the LEFT. BILATERAL L4 and L5 neural impingement. L5-S1: 1 mm anterolisthesis. Shallow central protrusion. Facet arthropathy. No definite impingement. IMPRESSION: LUMBAR MYELOGRAM IMPRESSION: Moderate multifactorial spinal stenosis L4-5. Mild dynamic instability at L5-S1, up to 3 mm with patient standing in flexion. CT LUMBAR MYELOGRAM IMPRESSION: Moderate spinal stenosis at L4-5 relates to central protrusion, and posterior element hypertrophy. BILATERAL L4 and L5 neural impingement. Facet mediated anterolisthesis at L5-S1 measures only 1 mm with patient recumbent. No impingement. Mild multilevel spondylosis, without significant neural impingement or stenosis L1-2 through L3-4. Aortic Atherosclerosis (ICD10-I70.0). Electronically Signed   By: Staci Righter M.D.   On: 08/28/2017 15:36    EKG: Orders placed or performed during the hospital encounter of 09/10/17  . EKG  . EKG     Hospital Course: Patient was admitted to [facility] and taken to the OR and underwent the above state procedure without complications.  Patient tolerated the procedure well and was later transferred to the recovery room and then to the orthopaedic floor for postoperative care.  They were given PO and IV analgesics for pain control following their surgery.  They were given 24 hours of postoperative antibiotics.   PT was consulted postop to assist with mobility and transfers.  The patient was allowed to be WBAT with therapy and was taught back precautions. Discharge planning was consulted to help with postop disposition and equipment needs.  Patient had a good night on the evening of surgery and started to get up  OOB with therapy on day one. Patient was seen in rounds and was ready to go home on day one.  They were given discharge instructions and dressing directions.  They were instructed on when to follow up in the office with Dr. Gladstone Lighter.   Diet: Cardiac diet Activity:WBAT Follow-up:in 2 weeks Disposition - Home Discharged Condition: stable   Discharge Instructions    Call MD / Call 911   Complete by:  As directed    If you experience chest pain or shortness of breath, CALL 911 and be transported to the hospital emergency room.  If you develope a fever above 101 F, pus (white drainage) or increased drainage or redness at the wound, or calf pain, call your surgeon's office.   Constipation Prevention   Complete by:  As directed    Drink plenty of fluids.  Prune juice may be helpful.  You may use a stool softener, such as Colace (over the counter) 100 mg twice a day.  Use MiraLax (over the counter) for constipation as needed.   Diet - low sodium heart healthy   Complete by:  As directed    Discharge instructions   Complete by:  As directed    For the first three days, remove your dressing, and tape a piece of saran wrap over your incision Take your shower, then remove the saran wrap and put a clean dressing on  After three days you can shower without the saran wrap.  No driving while taking pain medications No lifting or excessive bending. Call Dr. Gladstone Lighter if any wound complications or temperature of 101 degrees F or over.  Call the office for an appointment to see Dr. Gladstone Lighter in two weeks: [phone] and ask for Dr. Charlestine Night nurse, Brunilda Payor.   Increase activity slowly as tolerated   Complete by:  As directed      Allergies as of 09/12/2017      Reactions   Codeine Nausea And Vomiting   Penicillins Hives, Itching, Other (See Comments)   Has patient had a PCN reaction causing immediate rash, facial/tongue/throat swelling, SOB or lightheadedness with hypotension: No Has patient had a  PCN reaction causing severe rash involving mucus membranes or skin necrosis: No Has patient had a PCN reaction that required hospitalization: No Has patient had a PCN reaction occurring within the last 10 years: No If all of the above answers are "NO", then may proceed with Cephalosporin use.   Tizanidine Swelling   Caused Bottom of eye to swell    Valium [diazepam] Other (See Comments)   Hallucination   Oxycodone Rash      Medication List    TAKE these medications   ALAWAY 0.025 % ophthalmic solution Generic drug:  ketotifen Place 2 drops into both eyes daily as needed (for dry eyes).   amLODipine 5 MG tablet Commonly known as:  NORVASC Take 5 mg by mouth daily.   amphetamine-dextroamphetamine 20 MG tablet Commonly known as:  ADDERALL Take 20 mg by mouth 2 (two) times daily as needed (for focus).   aspirin EC 325 MG tablet Take 1 tablet (325 mg total) by mouth daily.   BELSOMRA 20 MG Tabs Generic drug:  Suvorexant Take 20 mg by mouth at bedtime.   cetirizine 10 MG tablet Commonly known as:  ZYRTEC Take 10 mg by mouth at bedtime.   clonazePAM 2 MG tablet Commonly known as:  KLONOPIN Take 4 mg by mouth at bedtime.   diclofenac 75 MG EC tablet Commonly known as:  VOLTAREN Take 75 mg by mouth 2 (two) times daily.   gabapentin 300 MG capsule Commonly known as:  NEURONTIN Take 300 mg by mouth 3 (three) times daily.   HYDROmorphone 2 MG tablet Commonly known as:  DILAUDID Take 1 tablet (2 mg total) by mouth every 4 (four) hours as needed for moderate pain.   levothyroxine 137 MCG tablet Commonly known as:  SYNTHROID, LEVOTHROID Take 137 mcg by mouth daily before breakfast.   losartan-hydrochlorothiazide 100-25 MG tablet Commonly known as:  HYZAAR Take 1 tablet by mouth daily.   montelukast 10 MG tablet Commonly known as:  SINGULAIR Take 10 mg by mouth at bedtime.   omeprazole 20 MG capsule Commonly known as:  PRILOSEC Take 20 mg by mouth daily.     promethazine 25 MG tablet Commonly known as:  PHENERGAN Take 25 mg by mouth every 6 (six) hours as needed for nausea or vomiting.   tiZANidine 4 MG tablet Commonly known as:  ZANAFLEX Take 4 mg by mouth 2 (two) times daily as needed for muscle spasms.   venlafaxine 37.5 MG tablet Commonly known as:  EFFEXOR Take 37.5 mg by mouth daily.      Follow-up Information    [name], MD. Schedule an appointment as soon as possible for a visit in 2 week(s).   Specialty:  Orthopedic Surgery Contact information: [place]  [place] [address] [phone]           Signed: Ardeen Jourdain, PA-C Orthopaedic Surgery 09/12/2017, 3:45 PM

## 2017-09-12 NOTE — Progress Notes (Signed)
Patient discharged to home with family. Given all belongings, instructions, prescriptions. Patient and family verbalized understanding of all instructions. Escorted to pov via w/c. Patient's dtr present for dressing change, all questions answered.

## 2017-09-12 NOTE — Discharge Instructions (Signed)
For the first three days, remove your dressing, and tape a piece of saran wrap over your incision. Take your shower, then remove the saran wrap and put a clean dressing on. °After three days you can shower without the saran wrap.  °No driving while taking pain medications °No lifting or excessive bending.  °Call Dr. Gioffre if any wound complications or temperature of 101 degrees F or over.  °Call the office for an appointment to see Dr. Gioffre in two weeks: [phone] and ask for Dr. Gioffre's nurse, Tammy Johnson.  °

## 2017-09-12 NOTE — Progress Notes (Signed)
Subjective: 1 Day Post-Op Procedure(s) (LRB): Decompressive Lumbar Laminectomy L4-L5 (N/A) Patient reports pain as moderate.   Patient seen in rounds with Dr. Gladstone Lighter. Patient is well, and has had no acute complaints or problems other than incisional back pain. No leg pain. Eating well. Voiding well. Positive flatus. No SOB or chest pain.  Plan is to go Home after hospital stay.  Objective: Vital signs in last 24 hours: Temp:  [97.7 F (36.5 C)-98.7 F (37.1 C)] 98.2 F (36.8 C) (07/24 0559) Pulse Rate:  [75-96] 79 (07/24 0559) Resp:  [13-16] 16 (07/24 0559) BP: (123-185)/(57-92) 123/57 (07/24 0559) SpO2:  [93 %-100 %] 93 % (07/24 0559) Weight:  [72.6 kg (160 lb)] 72.6 kg (160 lb) (07/23 1217)  Intake/Output from previous day:  Intake/Output Summary (Last 24 hours) at 09/12/2017 0923 Last data filed at 09/12/2017 0919 Gross per 24 hour  Intake 1904.17 ml  Output 1050 ml  Net 854.17 ml    Intake/Output this shift: Total I/O In: 240 [P.O.:240] Out: -   Labs: Recent Labs    09/10/17 1352  HGB 14.7   Recent Labs    09/10/17 1352  WBC 8.9  RBC 4.77  HCT 43.3  PLT 299   Recent Labs    09/10/17 1352  NA 143  K 3.9  CL 104  CO2 29  BUN 25*  CREATININE 1.15*  GLUCOSE 109*  CALCIUM 9.7   Recent Labs    09/10/17 1352  INR 1.12    EXAM General - Patient is Alert and Oriented Extremity - Neurologically intact Intact pulses distally Dorsiflexion/Plantar flexion intact Compartment soft Dressing - dressing C/D/I Motor Function - intact, moving foot and toes well on exam.    Past Medical History:  Diagnosis Date  . Anxiety   . Aortic atherosclerosis ([place])   . Asthma   . Bipolar disorder ([place])   . Breast cancer ([place])    Left  . Carpal tunnel syndrome    Bilateral  . Depression   . Diarrhea   . GERD (gastroesophageal reflux disease)   . Grade I diastolic dysfunction   . Heart murmur   . History of stomach ulcers   . Hypertension   .  Hypothyroidism   . Insomnia   . Low back pain   . Lumbar radiculopathy, chronic   . Migraines   . Mild aortic regurgitation   . MVP (mitral valve prolapse)   . OA (osteoarthritis)   . Pericardial effusion    Small  . PONV (postoperative nausea and vomiting)   . Right hip pain   . Seasonal allergies   . Spinal stenosis   . Spondylosis   . Syncope   . Vitamin B 12 deficiency     Assessment/Plan: 1 Day Post-Op Procedure(s) (LRB): Decompressive Lumbar Laminectomy L4-L5 (N/A) Active Problems:   Spinal stenosis, lumbar region with neurogenic claudication  Estimated body mass index is 29.26 kg/m as calculated from the following:   Height as of this encounter: 5\' 2"  (1.575 m).   Weight as of this encounter: 72.6 kg (160 lb). Advance diet Up with therapy D/C IV fluids when tolerating POs well  DVT Prophylaxis - Aspirin Weight-Bearing as tolerated  D/C O2 and Pulse OX and try on Room Air  She is progressing well. Will have her ambulate with PT today. Plan for DC home if she is meeting goals. Discussed DC instructions. Follow up in office in 2 weeks.   Ardeen Jourdain, PA-C Orthopaedic Surgery 09/12/2017, 9:23 AM

## 2017-09-12 NOTE — Evaluation (Signed)
Occupational Therapy Evaluation Patient Details Name: Kaitlinn Iversen MRN: [id number] DOB: August 23, 1950 Today's Date: 09/12/2017    History of Present Illness 67 yo female s/p laminetomy L4-L5 7/23. Hx of hip surgery.    Clinical Impression   Patient is s/p back surgery resulting in the deficits listed below (see OT Problem List).  Patient will benefit from skilled OT to increase their safety and independence with ADL and functional mobility for ADL (while adhering to their precautions) to facilitate discharge to venue listed below.      Follow Up Recommendations  No OT follow up    Equipment Recommendations  None recommended by OT    Recommendations for Other Services       Precautions / Restrictions Precautions Precautions: Back Precaution Comments: Reviewed back precautions and logroll technique Restrictions Weight Bearing Restrictions: No      Mobility Bed Mobility Overal bed mobility: Needs Assistance Bed Mobility: Rolling;Supine to Sit;Sit to Supine Rolling: Supervision Sidelying to sit: Supervision       General bed mobility comments: Pt familiar with logroll. She performed task well.   Transfers Overall transfer level: Needs assistance Equipment used: None;Rolling walker (2 wheeled) Transfers: Sit to/from [facility] Sit to Stand: Min guard;Modified independent (Device/Increase time) Stand pivot transfers: Min guard       General transfer comment: close guard for safety.     Balance Overall balance assessment: Mild deficits observed, not formally tested           Standing balance-Leahy Scale: Fair Standing balance comment: Pt walked to bathroom without using device. Noted to "furniture walk".                            ADL either performed or assessed with clinical judgement   ADL Overall ADL's : Needs assistance/impaired Eating/Feeding: Set up;Sitting   Grooming: Set up;Sitting   Upper Body Bathing: Minimal  assistance;Standing   Lower Body Bathing: Maximal assistance;Sit to/from stand;Cueing for sequencing;Cueing for safety;Cueing for compensatory techniques;Cueing for back precautions   Upper Body Dressing : Set up;Sitting   Lower Body Dressing: Maximal assistance;Sit to/from stand;Cueing for sequencing;Cueing for safety;Cueing for compensatory techniques;Cueing for back precautions   Toilet Transfer: Minimal assistance;RW;Comfort height toilet   Toileting- Clothing Manipulation and Hygiene: Moderate assistance;Sit to/from stand                            Pertinent Vitals/Pain Pain Assessment: Faces Pain Score: 2  Faces Pain Scale: Hurts little more Pain Location: back Pain Descriptors / Indicators: Sore;Aching;Discomfort Pain Intervention(s): Limited activity within patient's tolerance     Hand Dominance     Extremity/Trunk Assessment Upper Extremity Assessment Upper Extremity Assessment: Generalized weakness   Lower Extremity Assessment Lower Extremity Assessment: Generalized weakness   Cervical / Trunk Assessment Cervical / Trunk Assessment: Normal   Communication Communication Communication: No difficulties   Cognition Arousal/Alertness: Awake/alert Behavior During Therapy: WFL for tasks assessed/performed Overall Cognitive Status: Within Functional Limits for tasks assessed                                                Home Living Family/patient expects to be discharged to:: Private residence Living Arrangements: Spouse/significant other Available Help at Discharge: Family(daughter plans to assist as needed) Type of Home: [place]  Access: Level entry     Home Layout: Able to live on main level with bedroom/bathroom;Laundry or work area in [place]: Environmental consultant - 2 wheels          Prior Functioning/Environment Level of Independence: Independent                 OT Problem List: Decreased  strength;Decreased activity tolerance;Pain      OT Treatment/Interventions: Self-care/ADL training;Patient/family education;DME and/or AE instruction    OT Goals(Current goals can be found in the care plan section) Acute Rehab OT Goals Patient Stated Goal: home regain PLOF.  OT Goal Formulation: With patient Time For Goal Achievement: 09/19/17  OT Frequency: Min 2X/week   Barriers to D/C:            Co-evaluation              AM-PAC PT "6 Clicks" Daily Activity     Outcome Measure Help from another person eating meals?: None Help from another person taking care of personal grooming?: A Little Help from another person toileting, which includes using toliet, bedpan, or urinal?: A Little Help from another person bathing (including washing, rinsing, drying)?: A Little Help from another person to put on and taking off regular upper body clothing?: A Little Help from another person to put on and taking off regular lower body clothing?: A Lot 6 Click Score: 18   End of Session Nurse Communication: Mobility status  Activity Tolerance: Patient tolerated treatment well Patient left: in bed;with call bell/phone within reach  OT Visit Diagnosis: Unsteadiness on feet (R26.81);Muscle weakness (generalized) (M62.81);Pain                Time: 1050-1115 OT Time Calculation (min): 25 min Charges:  OT General Charges $OT Visit: 1 Visit OT Evaluation $OT Eval Moderate Complexity: 1 Mod OT Treatments $Self Care/Home Management : 8-22 mins G-Codes:     Kari Baars, [place]  Payton Mccallum D 09/12/2017, 12:56 PM

## 2017-12-27 ENCOUNTER — Ambulatory Visit: Payer: Medicare Other | Admitting: Internal Medicine

## 2017-12-31 ENCOUNTER — Encounter: Payer: Self-pay | Admitting: Gastroenterology

## 2018-01-08 ENCOUNTER — Encounter: Payer: Self-pay | Admitting: Gastroenterology

## 2018-01-08 ENCOUNTER — Other Ambulatory Visit (INDEPENDENT_AMBULATORY_CARE_PROVIDER_SITE_OTHER): Payer: Medicare Other

## 2018-01-08 ENCOUNTER — Encounter

## 2018-01-08 ENCOUNTER — Ambulatory Visit (INDEPENDENT_AMBULATORY_CARE_PROVIDER_SITE_OTHER): Payer: Medicare Other | Admitting: Gastroenterology

## 2018-01-08 VITALS — BP 122/80 | HR 80 | Ht 62.0 in | Wt 154.0 lb

## 2018-01-08 DIAGNOSIS — K580 Irritable bowel syndrome with diarrhea: Secondary | ICD-10-CM

## 2018-01-08 DIAGNOSIS — R1012 Left upper quadrant pain: Secondary | ICD-10-CM

## 2018-01-08 LAB — CREATININE, SERUM: Creatinine, Ser: 1.02 mg/dL (ref 0.40–1.20)

## 2018-01-08 LAB — BUN: BUN: 17 mg/dL (ref 6–23)

## 2018-01-08 NOTE — Progress Notes (Signed)
[facility] Consult Note:  History: Jillian Reid 01/08/2018  Referring physician: Lonia Mad, MD  Reason for consult/chief complaint: Abdominal Pain (Epigastric pain, primarily on both sides) and Irritable Bowel Syndrome (Diarrhea, urgent)   Subjective  HPI:  This is a pleasant 67 year old woman accompanied by her husband requesting evaluation for left upper quadrant pain.  She was referred by primary care after a recent visit with them. Jillian Reid describes developing sudden onset upper abdominal pain 3 to 4 weeks ago that came on over the course of the day and gradually brought her to the ED in [place] that evening.  Those records are not currently available, but we have made a request for them.  She gives an extensive story of a chronic pain syndrome with prior back and hip surgery where she was on chronic opioids until they were stopped by primary care sometime this year.  She is also had decades of insomnia and was on sleeping medication until this was also recently discontinued.  There were apparently concerns on the part of primary care about the habit-forming potential of these medicines, but she is still confused and upset about why they were stopped.  She was having increased anxiety related to insomnia around the time this pain started.  She was having both bilateral upper pain and some crampy lower abdominal pain as well as exacerbation of her usual IBS symptom of postprandial bowel movements with urgency and loose stool.  She denies rectal bleeding, her appetite is been decreased, she does not think she has lost weight. Jillian Reid was taking more frequent aspirin and NSAIDs due to her musculoskeletal pain since she was taken off opioids.  She feels that this pain is reminiscent of something that happened maybe 20 years ago when she was diagnosed with "ulcers" and gallbladder problems.  She feels the pain is reminiscent, and also thinks that it came on  abruptly at that time as well.  The pain was centered in the left upper quadrant then as it is now.  She underwent cholecystectomy, but she says the surgeon also did an upper endoscopy and revealed ulcers.  Details of that and circumstances are unknown at this point. He was taking once daily PPI intermittently on the suggestion of primary care, and has been taking it daily since the recent ED visit, when sucralfate was also prescribed.  She has not noticed any improvement in the pain with those medicines.  At this point, she has fairly persistent left upper quadrant pain that is there most of the time, has some episodic worsening, and she says she just "needs it gone".  She underwent colonoscopy June 2019 by Dr. Theodosia Blender in [place].  Indication was screening, reportedly normal colonoscopy to cecum.  Recommendation was for repeat exam in 5 years because of a reported history of polyp (details unknown).  No colon biopsies were taken, and no mention of abdominal pain or diarrhea in this procedure report.  ROS:  Review of Systems  Chronic back and hip pain Severe insomnia, worse on nights she will just lay awake all night long with no sleep.  She goes on extensively concerns about this and wondered whether I thought she should see a sleep specialist or how to approach this with primary care.  Past Medical History: Past Medical History:  Diagnosis Date  . Anxiety   . Aortic atherosclerosis ([place])   . Asthma   . Bipolar disorder ([place])   . Breast cancer ([place])  Left  . Carpal tunnel syndrome    Bilateral  . Depression   . Diarrhea   . GERD (gastroesophageal reflux disease)   . Grade I diastolic dysfunction   . Heart murmur   . History of stomach ulcers   . Hypertension   . Hypothyroidism   . Insomnia   . Low back pain   . Lumbar radiculopathy, chronic   . Migraines   . Mild aortic regurgitation   . MVP (mitral valve prolapse)   . OA (osteoarthritis)   . Pericardial  effusion    Small  . PONV (postoperative nausea and vomiting)   . Right hip pain   . Seasonal allergies   . Spinal stenosis   . Spondylosis   . Syncope   . Vitamin B 12 deficiency      Past Surgical History: Past Surgical History:  Procedure Laterality Date  . BREAST LUMPECTOMY     Left and reexcision  . CHOLECYSTECTOMY    . COLONOSCOPY    . GASTRIC FUNDOPLICATION    . HIP ARTHROPLASTY Right   . LUMBAR LAMINECTOMY/DECOMPRESSION MICRODISCECTOMY N/A 09/11/2017   Procedure: Decompressive Lumbar Laminectomy L4-L5;  Surgeon: [name], MD;  Location: [facility];  Service: Orthopedics;  Laterality: N/A;  . PARTIAL HYSTERECTOMY    . UPPER GI ENDOSCOPY       Family History: History reviewed. No pertinent family history. No known family history of GI malignancies  Social History: Social History   Socioeconomic History  . Marital status: Married    Spouse name: Not on file  . Number of children: 3  . Years of education: Not on file  . Highest education level: Not on file  Occupational History  . Not on file  Social Needs  . Financial resource strain: Not on file  . Food insecurity:    Worry: Not on file    Inability: Not on file  . Transportation needs:    Medical: Not on file    Non-medical: Not on file  Tobacco Use  . Smoking status: Never Smoker  . Smokeless tobacco: Never Used  Substance and Sexual Activity  . Alcohol use: Not Currently  . Drug use: Never  . Sexual activity: Not on file  Lifestyle  . Physical activity:    Days per week: Not on file    Minutes per session: Not on file  . Stress: Not on file  Relationships  . Social connections:    Talks on phone: Not on file    Gets together: Not on file    Attends religious service: Not on file    Active member of club or organization: Not on file    Attends meetings of clubs or organizations: Not on file    Relationship status: Not on file  Other Topics Concern  . Not on file  Social History  Narrative  . Not on file    Allergies: Allergies  Allergen Reactions  . Codeine Nausea And Vomiting  . Penicillins Hives, Itching and Other (See Comments)    Has patient had a PCN reaction causing immediate rash, facial/tongue/throat swelling, SOB or lightheadedness with hypotension: No Has patient had a PCN reaction causing severe rash involving mucus membranes or skin necrosis: No Has patient had a PCN reaction that required hospitalization: No Has patient had a PCN reaction occurring within the last 10 years: No If all of the above answers are "NO", then may proceed with Cephalosporin use.   . Tizanidine Swelling  Caused Bottom of eye to swell   . Valium [Diazepam] Other (See Comments)    Hallucination  . Oxycodone Rash    Outpatient Meds: Current Outpatient Medications  Medication Sig Dispense Refill  . amLODipine (NORVASC) 5 MG tablet Take 1 tablet by mouth daily.    . ARIPiprazole (ABILIFY) 5 MG tablet Take 1 tablet by mouth daily.    . cetirizine (ZYRTEC) 10 MG tablet Take 10 mg by mouth at bedtime.     . clonazePAM (KLONOPIN) 2 MG tablet Take 4 mg by mouth at bedtime.     Marland Kitchen levothyroxine (SYNTHROID, LEVOTHROID) 137 MCG tablet Take 137 mcg by mouth daily before breakfast.    . losartan-hydrochlorothiazide (HYZAAR) 100-25 MG tablet Take 1 tablet by mouth daily.    . montelukast (SINGULAIR) 10 MG tablet Take 10 mg by mouth at bedtime.    Marland Kitchen omeprazole (PRILOSEC) 20 MG capsule Take 20 mg by mouth daily.    . promethazine (PHENERGAN) 25 MG tablet Take 25 mg by mouth every 6 (six) hours as needed for nausea or vomiting.    . sucralfate (CARAFATE) 1 g tablet Take 1 g by mouth 4 (four) times daily -  with meals and at bedtime.    Marland Kitchen tiZANidine (ZANAFLEX) 4 MG tablet Take 4 mg by mouth 2 (two) times daily as needed for muscle spasms.     Marland Kitchen venlafaxine (EFFEXOR) 37.5 MG tablet Take 37.5 mg by mouth daily.     No current facility-administered medications for this visit.    Was on  aspirin and Belsomra  ___________________________________________________________________ Objective   Exam:  BP 122/80   Pulse 80   Ht 5\' 2"  (1.575 m)   Wt 154 lb (69.9 kg)   BMI 28.17 kg/m    General: this is a(n) pleasant and anxious appearing woman but it by her husband.  Good historian.  She is in no acute distress  Eyes: sclera anicteric, no redness  ENT: oral mucosa moist without lesions, no cervical or supraclavicular lymphadenopathy  CV: RRR without murmur, S1/S2, no JVD, no peripheral edema  Resp: clear to auscultation bilaterally, normal RR and effort noted  GI: soft, LUQ tenderness without guarding, with active bowel sounds. No guarding or palpable organomegaly noted.  Mild RLQ and RUQ pain and redness to a lesser degree   skin; warm and dry, no rash or jaundice noted  Neuro: awake, alert and oriented x 3. Normal gross motor function and fluent speech  No data available for review  Assessment: Encounter Diagnoses  Name Primary?  . LUQ pain Yes  . Irritable bowel syndrome with diarrhea     This pain is somewhat difficult to characterize.  Despite her reported history and how she feels it is reminiscent of ulcers, it does not sound typical for ulcer presentation.  I am concerned about possible diverticulitis or other intra-abdominal pathology outside the stomach.  She also sounds like she has underlying diarrhea predominant IBS that is lately somewhat worse with the advent of this pain.  As this has all occurred in the setting of exacerbation of chronic pain, anxiety and insomnia, it is possible that some underlying element of functional bowel disorder could be exacerbated.  Plan:  Try to obtain records from [facility] ED visit.  Upper endoscopy this week.  She is agreeable after discussion of procedure and risks.  The benefits and risks of the planned procedure were described in detail with the patient or (when appropriate) their health care proxy.  Risks were outlined as including, but not limited to, bleeding, infection, perforation, adverse medication reaction leading to cardiac or pulmonary decompensation, or pancreatitis (if ERCP).  The limitation of incomplete mucosal visualization was also discussed.  No guarantees or warranties were given.  CT abdomen and pelvis with oral and IV contrast.  Contact primary care to discuss concerns about pain syndrome, anxiety and insomnia and how best to address that.  Thank you for the courtesy of this consult.  Please call me with any questions or concerns.  Nelida Meuse III  CC: Jillian Mad, MD

## 2018-01-08 NOTE — Patient Instructions (Signed)
If you are age 67 or older, your body mass index should be between 23-30. Your Body mass index is 28.17 kg/m. If this is out of the aforementioned range listed, please consider follow up with your Primary Care Provider.  If you are age 23 or younger, your body mass index should be between 19-25. Your Body mass index is 28.17 kg/m. If this is out of the aformentioned range listed, please consider follow up with your Primary Care Provider.   You have been scheduled for a CT scan of the abdomen and pelvis at [facility] ([address]---this is in the same building as Press [facility]).   You are scheduled on 01-22-2018 at 11:15am. You should arrive 15 minutes prior to your appointment time for registration. Please follow the written instructions below on the day of your exam:  WARNING: IF YOU ARE ALLERGIC TO IODINE/X-RAY DYE, PLEASE NOTIFY RADIOLOGY IMMEDIATELY AT [phone]! YOU WILL BE GIVEN A 13 HOUR PREMEDICATION PREP.  1) Do not eat or drink anything after 715am (4 hours prior to your test) 2) You have been given 2 bottles of oral contrast to drink. The solution may taste better if refrigerated, but do NOT add ice or any other liquid to this solution. Shake well before drinking.    Drink 1 bottle of contrast @ 9:15amam (2 hours prior to your exam)  Drink 1 bottle of contrast @ 10:15am (1 hour prior to your exam)  You may take any medications as prescribed with a small amount of water, if necessary. If you take any of the following medications: METFORMIN, GLUCOPHAGE, GLUCOVANCE, AVANDAMET, RIOMET, FORTAMET, [place] MET, JANUMET, GLUMETZA or METAGLIP, you MAY be asked to HOLD this medication 48 hours AFTER the exam.  The purpose of you drinking the oral contrast is to aid in the visualization of your intestinal tract. The contrast solution may cause some diarrhea. Depending on your individual set of symptoms, you may also receive an intravenous injection of x-ray contrast/dye.  Plan on being at [facility] for 30 minutes or longer, depending on the type of exam you are having performed.  This test typically takes 30-45 minutes to complete.  If you have any questions regarding your exam or if you need to reschedule, you may call the CT department at [phone] between the hours of 8:00 am and 5:00 pm, Monday-Friday.  You have been scheduled for an endoscopy. Please follow written instructions given to you at your visit today. If you use inhalers (even only as needed), please bring them with you on the day of your procedure. Your physician has requested that you go to www.startemmi.com and enter the access code given to you at your visit today. This web site gives a general overview about your procedure. However, you should still follow specific instructions given to you by our office regarding your preparation for the procedure.  Your provider has requested that you go to the basement level for lab work before leaving today. Press "B" on the elevator. The lab is located at the first door on the left as you exit the elevator.  It was a pleasure to see you today!  Dr. Loletha Carrow   ________________________________________________________________________

## 2018-01-09 ENCOUNTER — Encounter: Payer: Self-pay | Admitting: Gastroenterology

## 2018-01-09 ENCOUNTER — Ambulatory Visit (AMBULATORY_SURGERY_CENTER): Payer: Medicare Other | Admitting: Gastroenterology

## 2018-01-09 VITALS — BP 159/81 | HR 67 | Temp 97.5°F | Resp 17 | Ht 62.0 in | Wt 154.0 lb

## 2018-01-09 DIAGNOSIS — K3189 Other diseases of stomach and duodenum: Secondary | ICD-10-CM

## 2018-01-09 DIAGNOSIS — R1012 Left upper quadrant pain: Secondary | ICD-10-CM

## 2018-01-09 MED ORDER — SODIUM CHLORIDE 0.9 % IV SOLN: 500.0000 mL | Freq: Once | INTRAVENOUS | Status: DC

## 2018-01-09 NOTE — Op Note (Addendum)
[facility] Patient Name: Jillian Reid Procedure Date: 01/09/2018 9:09 AM MRN: [id number] Endoscopist: [place]. Loletha Carrow , MD Age: 67 Referring MD:  Date of Birth: 01-08-1951 Gender: Female Account #: [id number] Procedure:                Upper GI endoscopy Indications:              Abdominal pain in the left upper quadrant Medicines:                Monitored Anesthesia Care Procedure:                Pre-Anesthesia Assessment:                           - Prior to the procedure, a History and Physical                            was performed, and patient medications and                            allergies were reviewed. The patient's tolerance of                            previous anesthesia was also reviewed. The risks                            and benefits of the procedure and the sedation                            options and risks were discussed with the patient.                            All questions were answered, and informed consent                            was obtained. Prior Anticoagulants: The patient has                            taken no previous anticoagulant or antiplatelet                            agents. ASA Grade Assessment: III - A patient with                            severe systemic disease. After reviewing the risks                            and benefits, the patient was deemed in                            satisfactory condition to undergo the procedure.                           After obtaining informed consent, the endoscope was  passed under direct vision. Throughout the                            procedure, the patient's blood pressure, pulse, and                            oxygen saturations were monitored continuously. The                            Model GIF-[id number]) scope was introduced                            through the mouth, and advanced to the second part                            of  duodenum. The upper GI endoscopy was                            accomplished without difficulty. The patient                            tolerated the procedure well. Scope In: Scope Out: Findings:                 The esophagus was normal.                           The entire examined stomach was normal. Biopsies                            were taken with a cold forceps for histology.                            (Sidney protocol).                           The cardia and gastric fundus were normal on                            retroflexion.                           The examined duodenum was normal. Complications:            No immediate complications. Estimated Blood Loss:     Estimated blood loss was minimal. Impression:               - Normal esophagus.                           - Normal stomach. Biopsied.                           - Normal examined duodenum.                           No ulcer seen. Recommendation:           -  Patient has a contact number available for                            emergencies. The signs and symptoms of potential                            delayed complications were discussed with the                            patient. Return to normal activities tomorrow.                            Written discharge instructions were provided to the                            patient.                           - Resume previous diet.                           - Continue present medications except for                            sucralfate.                           - Await pathology results.                           - Proceed with CT scan abdomen and pelvis as                            already scheduled. Henry L. Loletha Carrow, MD 01/09/2018 9:28:50 AM This report has been signed electronically.

## 2018-01-09 NOTE — Progress Notes (Signed)
Called to room to assist during endoscopic procedure.  Patient ID and intended procedure confirmed with present staff. Received instructions for my participation in the procedure from the performing physician.  

## 2018-01-09 NOTE — Progress Notes (Signed)
PT taken to PACU. Monitors in place. VSS. Report given to RN. 

## 2018-01-09 NOTE — Patient Instructions (Signed)
YOU HAD AN ENDOSCOPIC PROCEDURE TODAY AT THE [facility]:   Refer to the procedure report that was given to you for any specific questions about what was found during the examination.  If the procedure report does not answer your questions, please call your gastroenterologist to clarify.  If you requested that your care partner not be given the details of your procedure findings, then the procedure report has been included in a sealed envelope for you to review at your convenience later.  YOU SHOULD EXPECT: Some feelings of bloating in the abdomen. Passage of more gas than usual.  Walking can help get rid of the air that was put into your GI tract during the procedure and reduce the bloating. If you had a lower endoscopy (such as a colonoscopy or flexible sigmoidoscopy) you may notice spotting of blood in your stool or on the toilet paper. If you underwent a bowel prep for your procedure, you may not have a normal bowel movement for a few days.  Please Note:  You might notice some irritation and congestion in your nose or some drainage.  This is from the oxygen used during your procedure.  There is no need for concern and it should clear up in a day or so.  SYMPTOMS TO REPORT IMMEDIATELY:   Following upper endoscopy (EGD)  Vomiting of blood or coffee ground material  New chest pain or pain under the shoulder blades  Painful or persistently difficult swallowing  New shortness of breath  Fever of 100F or higher  Black, tarry-looking stools  For urgent or emergent issues, a gastroenterologist can be reached at any hour by calling [phone].   DIET:  We do recommend a small meal at first, but then you may proceed to your regular diet.  Drink plenty of fluids but you should avoid alcoholic beverages for 24 hours.  ACTIVITY:  You should plan to take it easy for the rest of today and you should NOT DRIVE or use heavy machinery until tomorrow (because of the sedation medicines used  during the test).    FOLLOW UP: Our staff will call the number listed on your records the next business day following your procedure to check on you and address any questions or concerns that you may have regarding the information given to you following your procedure. If we do not reach you, we will leave a message.  However, if you are feeling well and you are not experiencing any problems, there is no need to return our call.  We will assume that you have returned to your regular daily activities without incident.  If any biopsies were taken you will be contacted by phone or by letter within the next 1-3 weeks.  Please call us at [phone] if you have not heard about the biopsies in 3 weeks.    SIGNATURES/CONFIDENTIALITY: You and/or your care partner have signed paperwork which will be entered into your electronic medical record.  These signatures attest to the fact that that the information above on your After Visit Summary has been reviewed and is understood.  Full responsibility of the confidentiality of this discharge information lies with you and/or your care-partner. 

## 2018-01-10 ENCOUNTER — Telehealth: Payer: Self-pay | Admitting: *Deleted

## 2018-01-10 ENCOUNTER — Telehealth: Payer: Self-pay

## 2018-01-10 NOTE — Telephone Encounter (Signed)
  Follow up Call-  Call back number 01/09/2018  Post procedure Call Back phone  # [phone]  Permission to leave phone message Yes     Patient questions:  Phone just keeps ringing and ringing

## 2018-01-10 NOTE — Telephone Encounter (Signed)
  Follow up Call-  Call back number 01/09/2018  Post procedure Call Back phone  # [phone]  Permission to leave phone message Yes     Patient questions:  Do you have a fever, pain , or abdominal swelling? No. Pain Score  0 *  Have you tolerated food without any problems? Yes.    Have you been able to return to your normal activities? Yes.    Do you have any questions about your discharge instructions: Diet   No. Medications  No. Follow up visit  No.  Do you have questions or concerns about your Care? No.  Actions: * If pain score is 4 or above: No action needed, pain <4.

## 2018-01-22 ENCOUNTER — Ambulatory Visit (INDEPENDENT_AMBULATORY_CARE_PROVIDER_SITE_OTHER)
Admission: RE | Admit: 2018-01-22 | Discharge: 2018-01-22 | Disposition: A | Discharge status: Home / Self Care | Payer: Medicare Other | Source: Ambulatory Visit | Attending: Gastroenterology | Admitting: Gastroenterology

## 2018-01-22 DIAGNOSIS — R1012 Left upper quadrant pain: Secondary | ICD-10-CM | POA: Diagnosis not present

## 2018-01-22 DIAGNOSIS — K580 Irritable bowel syndrome with diarrhea: Secondary | ICD-10-CM | POA: Diagnosis not present

## 2018-01-22 MED ORDER — IOPAMIDOL (ISOVUE-300) INJECTION 61%
100.0000 mL | Freq: Once | INTRAVENOUS | Status: AC | PRN
  Administered 2018-01-22: 100 mL via INTRAVENOUS

## 2018-01-24 ENCOUNTER — Ambulatory Visit: Payer: Medicare Other | Admitting: Gastroenterology

## 2018-01-28 ENCOUNTER — Telehealth: Payer: Self-pay | Admitting: Gastroenterology

## 2018-01-28 NOTE — Telephone Encounter (Signed)
Pt calling for results, looks like CT scan results. Please advise.

## 2019-09-19 IMAGING — XA DG MYELOGRAPHY LUMBAR INJ LUMBOSACRAL
13 of 19 series · 13 of 19 positions shown · non-contrast
Comparison: None

CLINICAL DATA: Low back pain.  Suspected spinal stenosis.
TECHNIQUE: Contiguous axial images were obtained through the Lumbar spine after
the intrathecal infusion of infusion. Coronal and sagittal
reconstructions were obtained of the axial image sets.

[Series 1: vasc adipose · 1 of 1 slices shown (1 of 11)]
[im 1/1]
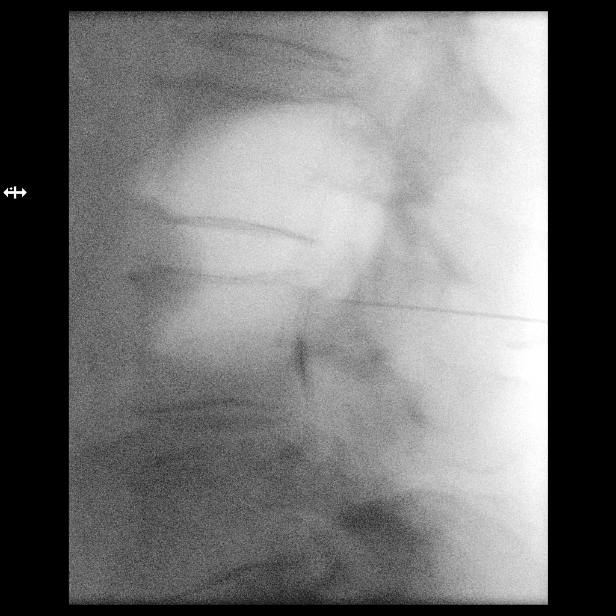

[Series 2: vasc adipose · 1 of 1 slices shown (2 of 11)]
[im 1/1]
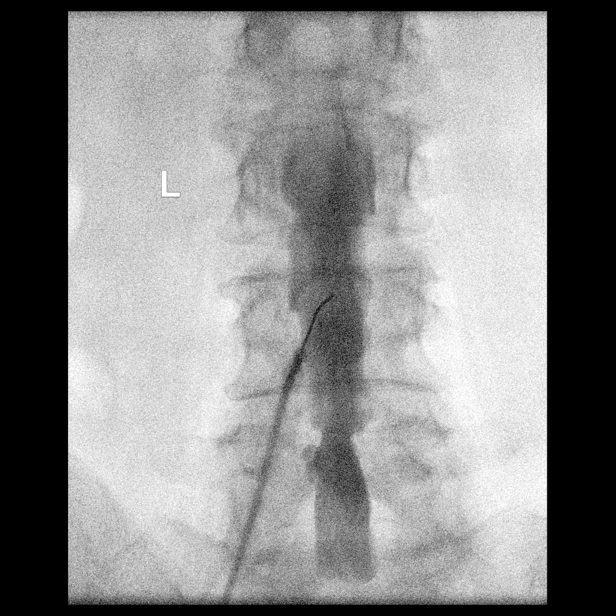

[Series 2: w lumbar spine lat · 0.15mm/px · 1 of 1 slices shown]
[im 1/1]
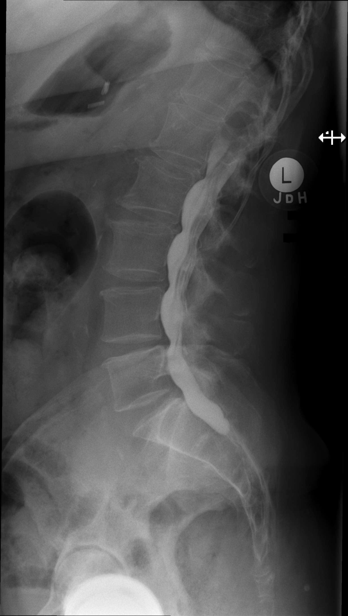

[Series 3: vasc adipose · 1 of 1 slices shown (3 of 11)]
[im 1/1]
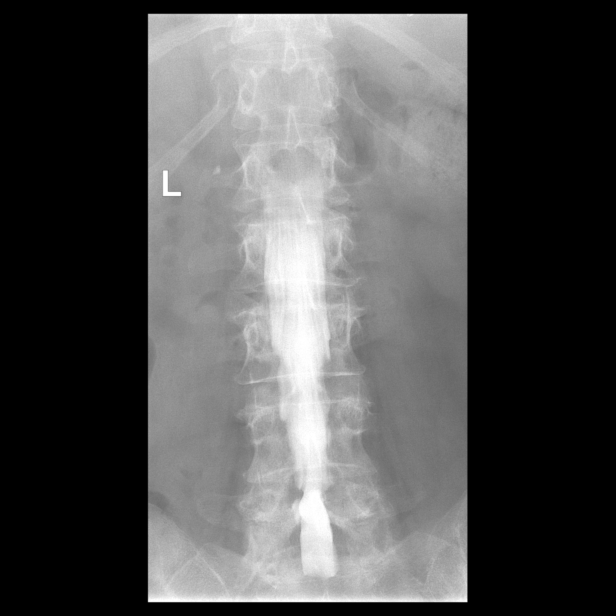

[Series 4: w lumbar spine extension · 0.15mm/px · 1 of 1 slices shown]
[im 1/1]
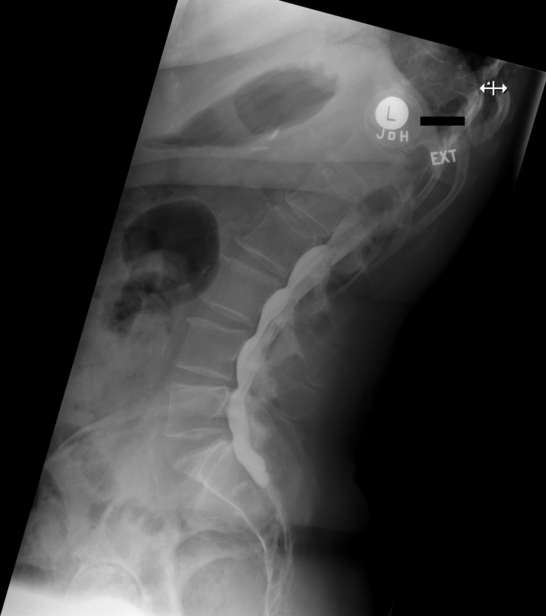

[Series 5: vasc adipose · 1 of 1 slices shown (4 of 11)]
[im 1/1]
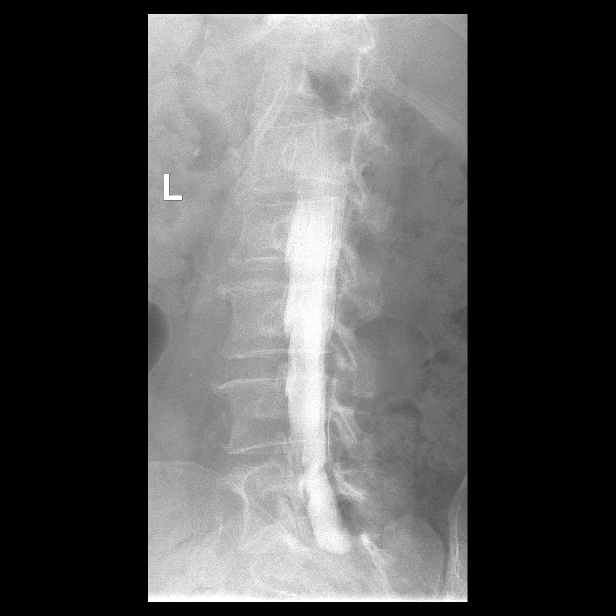

[Series 6: vasc adipose · 1 of 1 slices shown (5 of 11)]
[im 1/1]
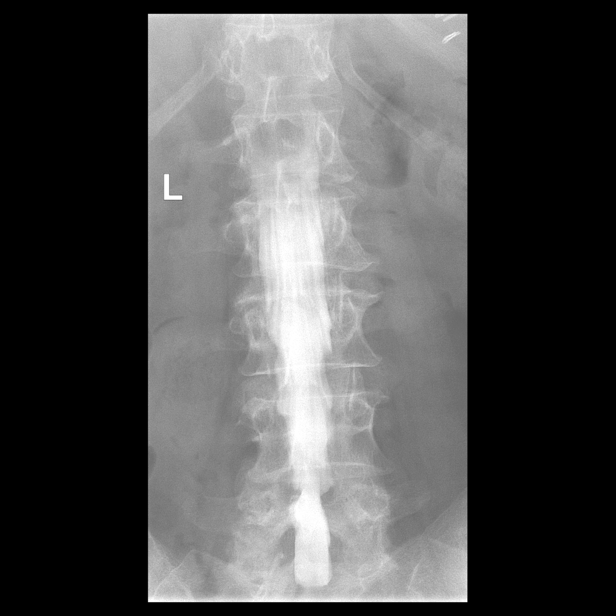

[Series 7: vasc adipose · 1 of 1 slices shown (6 of 11)]
[im 1/1]
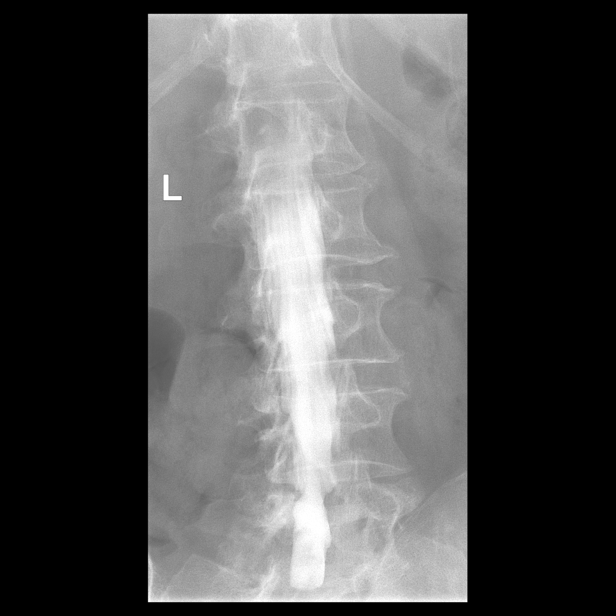

[Series 9: vasc adipose · 1 of 1 slices shown (7 of 11)]
[im 1/1]
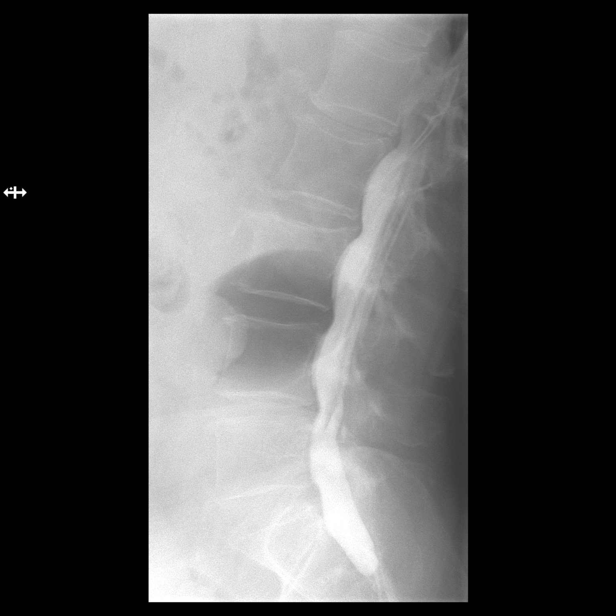

[Series 10: vasc adipose · 1 of 1 slices shown (8 of 11)]
[im 1/1]
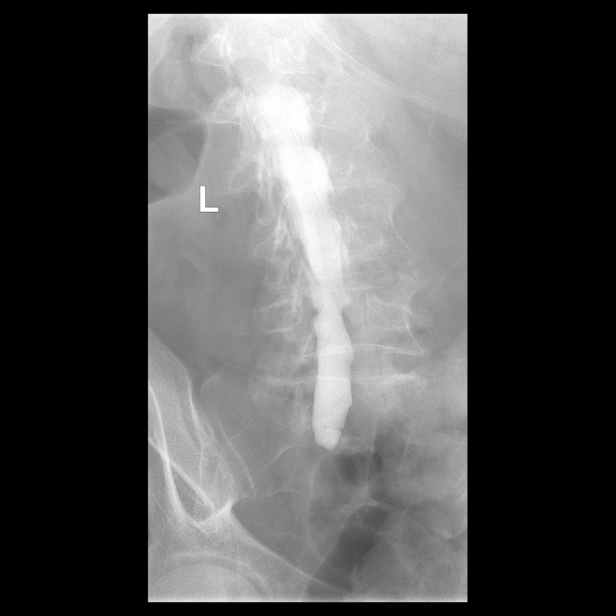

[Series 12: vasc adipose · 1 of 1 slices shown (9 of 11)]
[im 1/1]
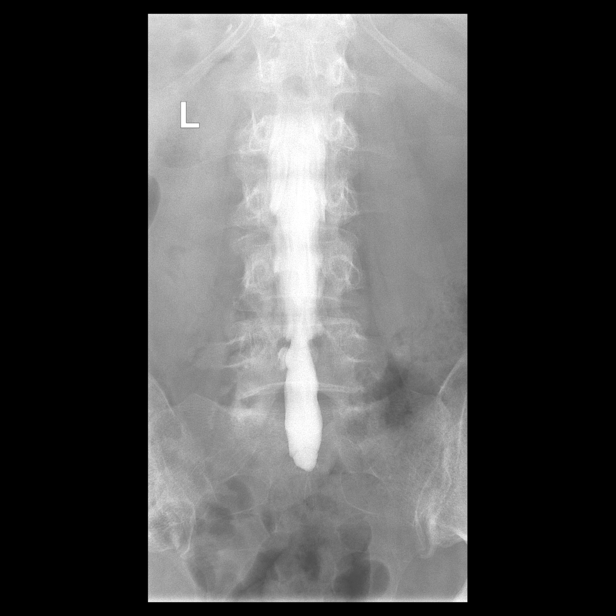

[Series 13: vasc adipose · 1 of 1 slices shown (10 of 11)]
[im 1/1]
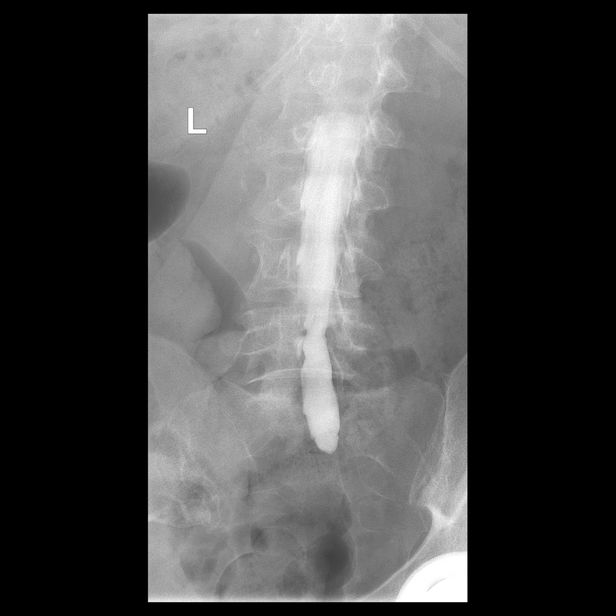

[Series 15: vasc adipose · 1 of 1 slices shown (11 of 11)]
[im 1/1]
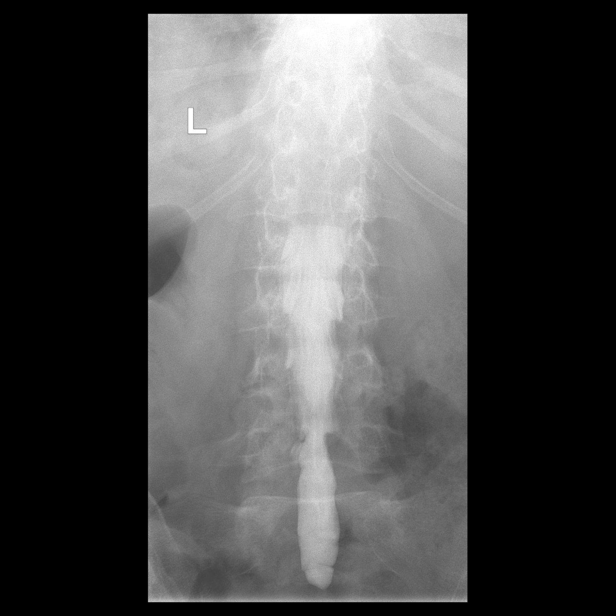

[13 of 19 positions shown; findings below may reference images not displayed]

EXAM:
LUMBAR MYELOGRAM

FLUOROSCOPY TIME:  dictate in minutes and seconds

PROCEDURE:
After thorough discussion of risks and benefits of the procedure
including bleeding, infection, injury to nerves, blood vessels,
adjacent structures as well as headache and CSF leak, written and
oral informed consent was obtained. Consent was obtained by Dr. Yichen
Agreda. Time out form was completed.

Patient was positioned prone on the fluoroscopy table. Local
anesthesia was provided with 1% lidocaine without epinephrine after
prepped and draped in the usual sterile fashion. Puncture was
performed at L3-L4 using a 3 1/2 inch 22-gauge spinal needle via
midline approach. Using a single pass through the dura, the needle
was placed within the thecal sac, with return of clear CSF. 15 mL of
Isovue F-W11 was injected into the thecal sac, with normal
opacification of the nerve roots and cauda equina consistent with
free flow within the subarachnoid space.

I personally performed the lumbar puncture and administered the
intrathecal contrast. I also personally supervised acquisition of
the myelogram images.
FINDINGS: LUMBAR MYELOGRAM FINDINGS:

Good opacification lumbar subarachnoid space. Severe stenosis at
L4-5, waist like narrowing, ventral defect, BILATERAL L5 nerve root
cut off. Anatomic alignment with patient prone for myelography.
Shallow ventral defects also noted at L3-4, L2-3, and L1-2.

With patient standing, on AP view, stenosis at L4-5 is increased.
This is likely due to ligamentum flavum infolding. Central
protrusion is moderately more prominent. There is shallow extradural
defects at L3-4 bilaterally with patient standing. There is no
anterolisthesis however. Ventral defects at L1-2, L2-3, and L3-4
mildly increased.

The patient develops 3 mm of anterolisthesis at L5-S1 in standing
neutral. There is also 3 mm anterolisthesis L5-S1 in flexion. In
extension, the anterolisthesis decreases to 1 mm.

No anterolisthesis/dynamic instability at the L4-5 level.

RIGHT total hip arthroplasty.

CT LUMBAR MYELOGRAM FINDINGS:

Segmentation: Normal.

Alignment: L5-S1 demonstrates only minimal 1 mm anterolisthesis with
patient recumbent for CT. There is also 1 mm retrolisthesis L1-2.

Vertebrae: No worrisome osseous lesion.

Conus medullaris: Normal in size and location.

Paraspinal tissues: No evidence for hydronephrosis or paravertebral
mass. Cholecystectomy. Aortic atherosclerosis.

Disc levels:

L1-L2:  Trace retrolisthesis.  Annular bulge.  No impingement.

L2-L3:  Shallow protrusion, calcified annulus.  No impingement.

L3-L4:  Annular bulge.  Calcified annulus.  No impingement.

L4-L5: Moderate multifactorial spinal stenosis related to central
protrusion, facet arthropathy, and ligamentum flavum hypertrophy.
Calcified annulus, greater on the LEFT. BILATERAL L4 and L5 neural
impingement.

L5-S1: 1 mm anterolisthesis. Shallow central protrusion. Facet
arthropathy. No definite impingement.
IMPRESSION: LUMBAR MYELOGRAM IMPRESSION:

Moderate multifactorial spinal stenosis L4-5. Mild dynamic
instability at L5-S1, up to 3 mm with patient standing in flexion.

CT LUMBAR MYELOGRAM IMPRESSION:

Moderate spinal stenosis at L4-5 relates to central protrusion, and
posterior element hypertrophy. BILATERAL L4 and L5 neural
impingement.

Facet mediated anterolisthesis at L5-S1 measures only 1 mm with
patient recumbent. No impingement.

Mild multilevel spondylosis, without significant neural impingement
or stenosis L1-2 through L3-4.

Aortic Atherosclerosis (UU7U0-4S8.8).

## 2019-09-19 IMAGING — CT CT L SPINE W/ CM
1 of 6 series · 6 of 14 positions shown, 8 images · non-contrast
Comparison: None

CLINICAL DATA: Low back pain.  Suspected spinal stenosis.
TECHNIQUE: Contiguous axial images were obtained through the Lumbar spine after
the intrathecal infusion of infusion. Coronal and sagittal
reconstructions were obtained of the axial image sets.

[Series 3: l spine soft · axial · 0.31mm/px · z∈[-322,-151]mm · 6 of 81 slices shown, 8 images]
[im 12/81  soft-tissue]
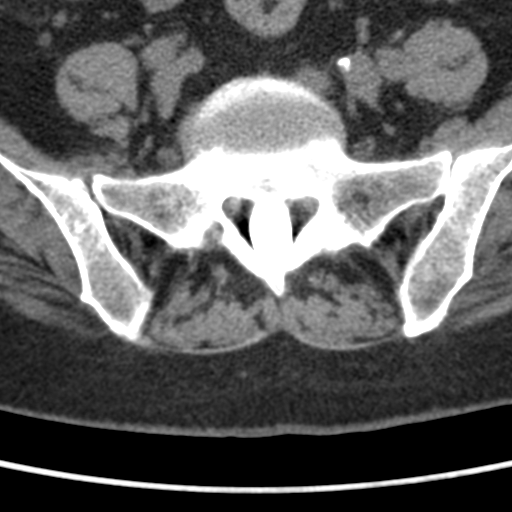
[im 12/81  bone]
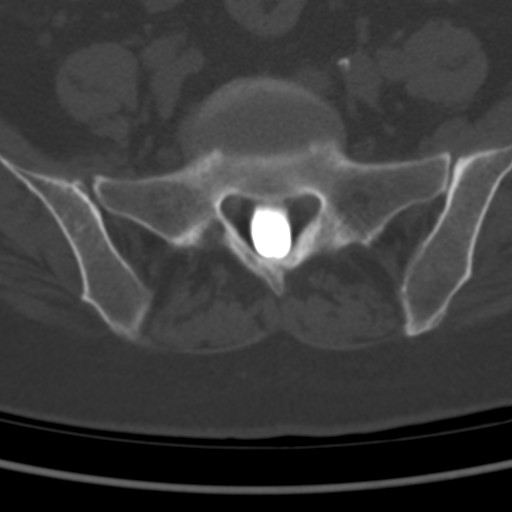
[im 23/81  bone]
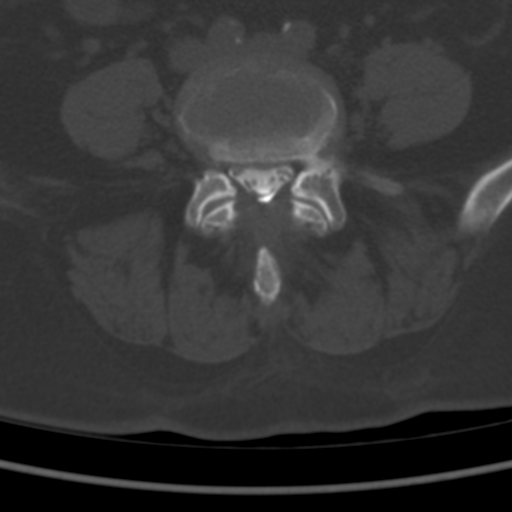
[im 35/81  bone]
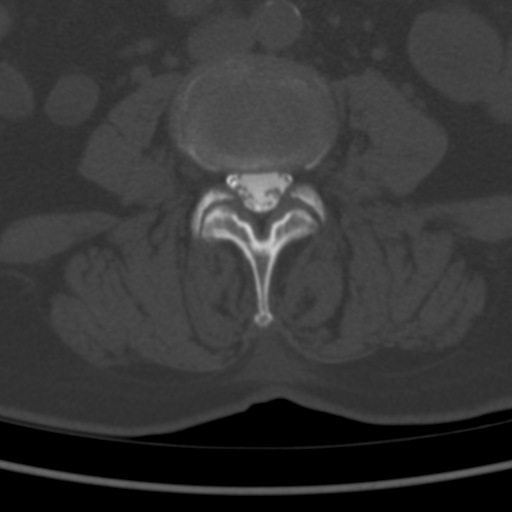
[im 46/81  bone]
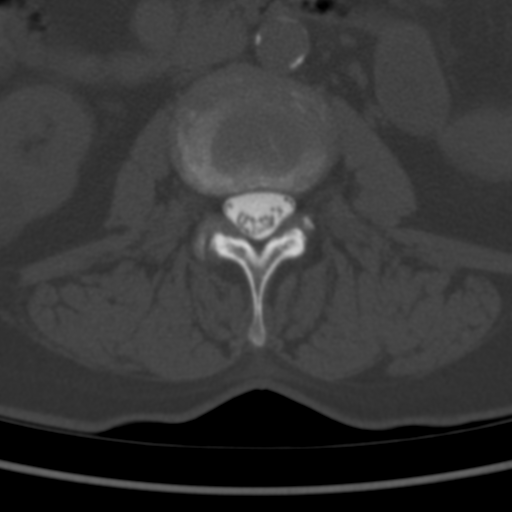
[im 58/81  soft-tissue]
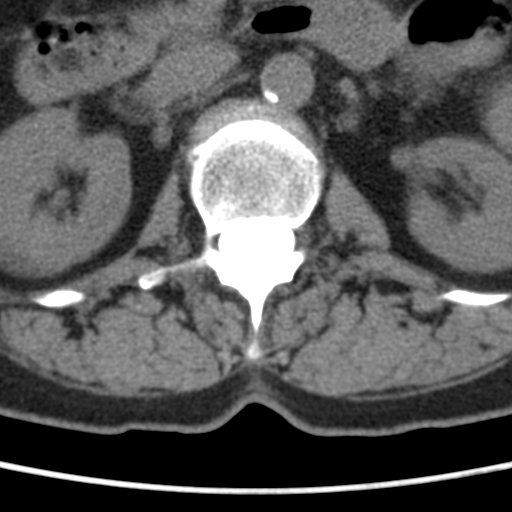
[im 58/81  bone]
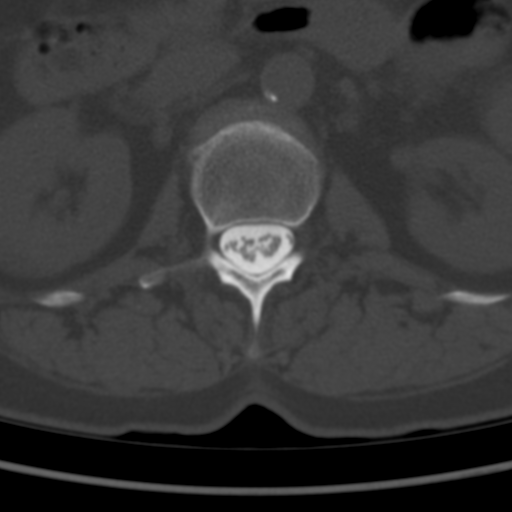
[im 69/81  bone]
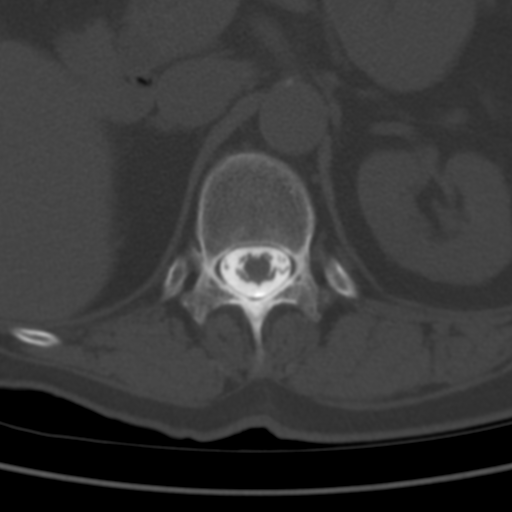

[6 of 14 positions shown; findings below may reference images not displayed]

EXAM:
LUMBAR MYELOGRAM

FLUOROSCOPY TIME:  dictate in minutes and seconds

PROCEDURE:
After thorough discussion of risks and benefits of the procedure
including bleeding, infection, injury to nerves, blood vessels,
adjacent structures as well as headache and CSF leak, written and
oral informed consent was obtained. Consent was obtained by Dr. Yichen
Agreda. Time out form was completed.

Patient was positioned prone on the fluoroscopy table. Local
anesthesia was provided with 1% lidocaine without epinephrine after
prepped and draped in the usual sterile fashion. Puncture was
performed at L3-L4 using a 3 1/2 inch 22-gauge spinal needle via
midline approach. Using a single pass through the dura, the needle
was placed within the thecal sac, with return of clear CSF. 15 mL of
Isovue F-W11 was injected into the thecal sac, with normal
opacification of the nerve roots and cauda equina consistent with
free flow within the subarachnoid space.

I personally performed the lumbar puncture and administered the
intrathecal contrast. I also personally supervised acquisition of
the myelogram images.
FINDINGS: LUMBAR MYELOGRAM FINDINGS:

Good opacification lumbar subarachnoid space. Severe stenosis at
L4-5, waist like narrowing, ventral defect, BILATERAL L5 nerve root
cut off. Anatomic alignment with patient prone for myelography.
Shallow ventral defects also noted at L3-4, L2-3, and L1-2.

With patient standing, on AP view, stenosis at L4-5 is increased.
This is likely due to ligamentum flavum infolding. Central
protrusion is moderately more prominent. There is shallow extradural
defects at L3-4 bilaterally with patient standing. There is no
anterolisthesis however. Ventral defects at L1-2, L2-3, and L3-4
mildly increased.

The patient develops 3 mm of anterolisthesis at L5-S1 in standing
neutral. There is also 3 mm anterolisthesis L5-S1 in flexion. In
extension, the anterolisthesis decreases to 1 mm.

No anterolisthesis/dynamic instability at the L4-5 level.

RIGHT total hip arthroplasty.

CT LUMBAR MYELOGRAM FINDINGS:

Segmentation: Normal.

Alignment: L5-S1 demonstrates only minimal 1 mm anterolisthesis with
patient recumbent for CT. There is also 1 mm retrolisthesis L1-2.

Vertebrae: No worrisome osseous lesion.

Conus medullaris: Normal in size and location.

Paraspinal tissues: No evidence for hydronephrosis or paravertebral
mass. Cholecystectomy. Aortic atherosclerosis.

Disc levels:

L1-L2:  Trace retrolisthesis.  Annular bulge.  No impingement.

L2-L3:  Shallow protrusion, calcified annulus.  No impingement.

L3-L4:  Annular bulge.  Calcified annulus.  No impingement.

L4-L5: Moderate multifactorial spinal stenosis related to central
protrusion, facet arthropathy, and ligamentum flavum hypertrophy.
Calcified annulus, greater on the LEFT. BILATERAL L4 and L5 neural
impingement.

L5-S1: 1 mm anterolisthesis. Shallow central protrusion. Facet
arthropathy. No definite impingement.
IMPRESSION: LUMBAR MYELOGRAM IMPRESSION:

Moderate multifactorial spinal stenosis L4-5. Mild dynamic
instability at L5-S1, up to 3 mm with patient standing in flexion.

CT LUMBAR MYELOGRAM IMPRESSION:

Moderate spinal stenosis at L4-5 relates to central protrusion, and
posterior element hypertrophy. BILATERAL L4 and L5 neural
impingement.

Facet mediated anterolisthesis at L5-S1 measures only 1 mm with
patient recumbent. No impingement.

Mild multilevel spondylosis, without significant neural impingement
or stenosis L1-2 through L3-4.

Aortic Atherosclerosis (UU7U0-4S8.8).

## 2019-11-10 ENCOUNTER — Telehealth: Payer: Self-pay | Admitting: Gastroenterology

## 2019-11-10 MED ORDER — SUCRALFATE 1 G PO TABS: 1.0000 g | ORAL_TABLET | Freq: Three times a day (TID) | ORAL | 0 refills | Status: DC

## 2019-11-10 NOTE — Telephone Encounter (Signed)
Difficult to say what this is. I saw her once nearly 2 years ago for chronic abdominal pain, no ulcers on the EGD I performed. Needs clinic visit as scheduled.  Can go to local urgent care or ED in meantime if unrelenting pain and/or vomiting.  I refilled Rx for a small supply of carafate.  - HD

## 2019-11-10 NOTE — Telephone Encounter (Signed)
Spoke with patient regarding recommendations. Advised patient to keep scheduled appt and to go to urgent care or ED if she develops unrelenting pain and/or vomiting. Pt advised that RX has been sent in. Pt verbalized understanding, no other concerns at this time.

## 2019-11-10 NOTE — Telephone Encounter (Signed)
Patient called states she is having a lot of pain thinks she has ulcers is seeking a medication to coat her stomach if possible. Please advise

## 2019-11-10 NOTE — Telephone Encounter (Signed)
Spoke with patient, she states that her stomach ulcers are bothering her. Pt states that it has been creeping up on her for some time and she was doubled over this morning in pain, pepto bismol today, nausea, no vomiting, reports epigastric pain. Pt states that she is not taking any NSAIDs. Advised patient to eat a bland diet and to avoid, spicy, fried, greasy, heavily seasoned foods at this time. Pt states that she thinks it is stress ulcers, pt states that she is out of Carafate and has not been taking it, okay to send in a new RX. Pt is scheduled to see Tye Savoy, NP on 12/04/19. Please advise, thank you

## 2019-12-04 ENCOUNTER — Ambulatory Visit: Payer: Medicare Other | Admitting: Nurse Practitioner

## 2021-10-12 ENCOUNTER — Ambulatory Visit: Payer: Self-pay | Admitting: Orthopedic Surgery

## 2021-10-12 MED ORDER — VANCOMYCIN HCL 10 G IV SOLR: 1500.0000 mg | Freq: Once | INTRAVENOUS | Status: DC

## 2021-10-19 ENCOUNTER — Encounter (HOSPITAL_COMMUNITY): Payer: Self-pay | Admitting: Orthopedic Surgery

## 2021-10-20 ENCOUNTER — Encounter (HOSPITAL_COMMUNITY)
Admission: RE | Disposition: A | Discharge status: Home / Self Care | Payer: Self-pay | Source: Home / Self Care | Attending: Orthopedic Surgery

## 2021-10-20 ENCOUNTER — Ambulatory Visit (HOSPITAL_BASED_OUTPATIENT_CLINIC_OR_DEPARTMENT_OTHER): Payer: Medicare Other | Admitting: Physician Assistant

## 2021-10-20 ENCOUNTER — Other Ambulatory Visit: Payer: Self-pay

## 2021-10-20 ENCOUNTER — Ambulatory Visit (HOSPITAL_COMMUNITY): Payer: Medicare Other | Admitting: Physician Assistant

## 2021-10-20 ENCOUNTER — Observation Stay (HOSPITAL_COMMUNITY)
Admission: RE | Admit: 2021-10-20 | Discharge: 2021-10-21 | Disposition: A | Discharge status: Home / Self Care | Payer: Medicare Other | Attending: Orthopedic Surgery | Admitting: Orthopedic Surgery

## 2021-10-20 ENCOUNTER — Encounter (HOSPITAL_COMMUNITY): Payer: Self-pay | Admitting: Orthopedic Surgery

## 2021-10-20 ENCOUNTER — Ambulatory Visit (HOSPITAL_COMMUNITY): Payer: Medicare Other

## 2021-10-20 DIAGNOSIS — E039 Hypothyroidism, unspecified: Secondary | ICD-10-CM | POA: Diagnosis not present

## 2021-10-20 DIAGNOSIS — G894 Chronic pain syndrome: Secondary | ICD-10-CM

## 2021-10-20 DIAGNOSIS — Z8673 Personal history of transient ischemic attack (TIA), and cerebral infarction without residual deficits: Secondary | ICD-10-CM | POA: Insufficient documentation

## 2021-10-20 DIAGNOSIS — Z79899 Other long term (current) drug therapy: Secondary | ICD-10-CM | POA: Insufficient documentation

## 2021-10-20 DIAGNOSIS — F418 Other specified anxiety disorders: Secondary | ICD-10-CM

## 2021-10-20 DIAGNOSIS — G8929 Other chronic pain: Secondary | ICD-10-CM | POA: Diagnosis present

## 2021-10-20 DIAGNOSIS — J45909 Unspecified asthma, uncomplicated: Secondary | ICD-10-CM | POA: Insufficient documentation

## 2021-10-20 DIAGNOSIS — Z853 Personal history of malignant neoplasm of breast: Secondary | ICD-10-CM | POA: Diagnosis not present

## 2021-10-20 DIAGNOSIS — I10 Essential (primary) hypertension: Secondary | ICD-10-CM

## 2021-10-20 HISTORY — PX: SPINAL CORD STIMULATOR INSERTION: SHX5378

## 2021-10-20 HISTORY — DX: Dyspnea, unspecified: R06.00

## 2021-10-20 HISTORY — DX: Cerebral infarction, unspecified: I63.9

## 2021-10-20 LAB — SURGICAL PCR SCREEN
MRSA, PCR: NEGATIVE
Staphylococcus aureus: NEGATIVE

## 2021-10-20 SURGERY — INSERTION, SPINAL CORD STIMULATOR, LUMBAR
Anesthesia: General | Site: Spine Thoracic

## 2021-10-20 MED ORDER — PROPOFOL 10 MG/ML IV BOLUS
INTRAVENOUS | Status: DC | PRN
  Administered 2021-10-20: 150 mg via INTRAVENOUS

## 2021-10-20 MED ORDER — PHENYLEPHRINE 80 MCG/ML (10ML) SYRINGE FOR IV PUSH (FOR BLOOD PRESSURE SUPPORT)
PREFILLED_SYRINGE | INTRAVENOUS | Status: AC
  Filled 2021-10-20: qty 10

## 2021-10-20 MED ORDER — HYDROMORPHONE HCL 1 MG/ML IJ SOLN
0.2500 mg | INTRAMUSCULAR | Status: DC | PRN
  Administered 2021-10-20 (×2): 0.5 mg via INTRAVENOUS

## 2021-10-20 MED ORDER — FENTANYL CITRATE (PF) 250 MCG/5ML IJ SOLN
INTRAMUSCULAR | Status: AC
  Filled 2021-10-20: qty 5

## 2021-10-20 MED ORDER — ONDANSETRON HCL 4 MG/2ML IJ SOLN: 4.0000 mg | Freq: Four times a day (QID) | INTRAMUSCULAR | Status: DC | PRN

## 2021-10-20 MED ORDER — KETAMINE HCL 50 MG/5ML IJ SOSY
PREFILLED_SYRINGE | INTRAMUSCULAR | Status: AC
  Filled 2021-10-20: qty 5

## 2021-10-20 MED ORDER — LIDOCAINE 2% (20 MG/ML) 5 ML SYRINGE
INTRAMUSCULAR | Status: AC
  Filled 2021-10-20: qty 5

## 2021-10-20 MED ORDER — 0.9 % SODIUM CHLORIDE (POUR BTL) OPTIME
TOPICAL | Status: DC | PRN
  Administered 2021-10-20: 1000 mL

## 2021-10-20 MED ORDER — ACETAMINOPHEN 325 MG PO TABS
650.0000 mg | ORAL_TABLET | ORAL | Status: DC | PRN
  Administered 2021-10-20 – 2021-10-21 (×3): 650 mg via ORAL
  Filled 2021-10-20 (×3): qty 2

## 2021-10-20 MED ORDER — ORAL CARE MOUTH RINSE: 15.0000 mL | Freq: Once | OROMUCOSAL | Status: AC

## 2021-10-20 MED ORDER — PHENOL 1.4 % MT LIQD: 1.0000 | OROMUCOSAL | Status: DC | PRN

## 2021-10-20 MED ORDER — CHLORHEXIDINE GLUCONATE 0.12 % MT SOLN
15.0000 mL | Freq: Once | OROMUCOSAL | Status: AC
  Administered 2021-10-20: 15 mL via OROMUCOSAL
  Filled 2021-10-20: qty 15

## 2021-10-20 MED ORDER — SODIUM CHLORIDE 0.9% FLUSH
3.0000 mL | Freq: Two times a day (BID) | INTRAVENOUS | Status: DC
  Administered 2021-10-20: 3 mL via INTRAVENOUS

## 2021-10-20 MED ORDER — FENTANYL CITRATE (PF) 250 MCG/5ML IJ SOLN
INTRAMUSCULAR | Status: DC | PRN
  Administered 2021-10-20: 100 ug via INTRAVENOUS
  Administered 2021-10-20: 25 ug via INTRAVENOUS
  Administered 2021-10-20: 50 ug via INTRAVENOUS

## 2021-10-20 MED ORDER — ONDANSETRON HCL 4 MG PO TABS: 4.0000 mg | ORAL_TABLET | Freq: Three times a day (TID) | ORAL | 0 refills | Status: AC | PRN

## 2021-10-20 MED ORDER — ROCURONIUM BROMIDE 10 MG/ML (PF) SYRINGE
PREFILLED_SYRINGE | INTRAVENOUS | Status: DC | PRN
  Administered 2021-10-20: 60 mg via INTRAVENOUS

## 2021-10-20 MED ORDER — SODIUM CHLORIDE 0.9 % IV SOLN: INTRAVENOUS | Status: DC

## 2021-10-20 MED ORDER — HYDROCODONE-ACETAMINOPHEN 7.5-325 MG PO TABS
1.0000 | ORAL_TABLET | Freq: Once | ORAL | Status: AC | PRN
  Administered 2021-10-20: 1 via ORAL

## 2021-10-20 MED ORDER — VANCOMYCIN HCL IN DEXTROSE 1-5 GM/200ML-% IV SOLN
1000.0000 mg | Freq: Once | INTRAVENOUS | Status: AC
  Administered 2021-10-21: 1000 mg via INTRAVENOUS
  Filled 2021-10-20 (×2): qty 200

## 2021-10-20 MED ORDER — PHENYLEPHRINE HCL-NACL 20-0.9 MG/250ML-% IV SOLN
INTRAVENOUS | Status: DC | PRN
  Administered 2021-10-20: 20 ug/min via INTRAVENOUS

## 2021-10-20 MED ORDER — FLEET ENEMA 7-19 GM/118ML RE ENEM: 1.0000 | ENEMA | Freq: Once | RECTAL | Status: DC | PRN

## 2021-10-20 MED ORDER — OXYCODONE HCL 5 MG PO TABS
10.0000 mg | ORAL_TABLET | ORAL | Status: DC | PRN
  Administered 2021-10-21: 10 mg via ORAL
  Filled 2021-10-20: qty 2

## 2021-10-20 MED ORDER — LEVOTHYROXINE SODIUM 88 MCG PO TABS
88.0000 ug | ORAL_TABLET | Freq: Every day | ORAL | Status: DC
  Administered 2021-10-21: 88 ug via ORAL
  Filled 2021-10-20: qty 1

## 2021-10-20 MED ORDER — MIDAZOLAM HCL 2 MG/2ML IJ SOLN
INTRAMUSCULAR | Status: AC
  Filled 2021-10-20: qty 2

## 2021-10-20 MED ORDER — METHOCARBAMOL 1000 MG/10ML IJ SOLN: 500.0000 mg | Freq: Four times a day (QID) | INTRAVENOUS | Status: DC | PRN

## 2021-10-20 MED ORDER — HYDROCHLOROTHIAZIDE 12.5 MG PO TABS
12.5000 mg | ORAL_TABLET | Freq: Every day | ORAL | Status: DC
  Administered 2021-10-20 – 2021-10-21 (×2): 12.5 mg via ORAL
  Filled 2021-10-20 (×2): qty 1

## 2021-10-20 MED ORDER — HYDROXYZINE HCL 25 MG PO TABS
25.0000 mg | ORAL_TABLET | Freq: Three times a day (TID) | ORAL | Status: DC | PRN
  Administered 2021-10-20: 25 mg via ORAL
  Filled 2021-10-20: qty 3
  Filled 2021-10-20: qty 1

## 2021-10-20 MED ORDER — HYDROCODONE-ACETAMINOPHEN 10-325 MG PO TABS: 1.0000 | ORAL_TABLET | Freq: Four times a day (QID) | ORAL | 0 refills | Status: AC | PRN

## 2021-10-20 MED ORDER — HYDROMORPHONE HCL 1 MG/ML IJ SOLN: 0.5000 mg | INTRAMUSCULAR | Status: DC | PRN

## 2021-10-20 MED ORDER — LACTATED RINGERS IV SOLN: INTRAVENOUS | Status: DC

## 2021-10-20 MED ORDER — ACETAMINOPHEN 650 MG RE SUPP: 650.0000 mg | RECTAL | Status: DC | PRN

## 2021-10-20 MED ORDER — EPHEDRINE SULFATE-NACL 50-0.9 MG/10ML-% IV SOSY
PREFILLED_SYRINGE | INTRAVENOUS | Status: DC | PRN
  Administered 2021-10-20: 2.5 mg via INTRAVENOUS
  Administered 2021-10-20: 7.5 mg via INTRAVENOUS
  Administered 2021-10-20: 5 mg via INTRAVENOUS

## 2021-10-20 MED ORDER — OXYCODONE HCL 5 MG PO TABS
5.0000 mg | ORAL_TABLET | ORAL | Status: DC | PRN
  Administered 2021-10-21: 5 mg via ORAL
  Filled 2021-10-20: qty 1

## 2021-10-20 MED ORDER — CLONAZEPAM 0.5 MG PO TABS
1.0000 mg | ORAL_TABLET | Freq: Every day | ORAL | Status: DC
  Administered 2021-10-20: 1 mg via ORAL
  Filled 2021-10-20: qty 2

## 2021-10-20 MED ORDER — ONDANSETRON HCL 4 MG/2ML IJ SOLN: 4.0000 mg | Freq: Once | INTRAMUSCULAR | Status: DC | PRN

## 2021-10-20 MED ORDER — PROPOFOL 10 MG/ML IV BOLUS
INTRAVENOUS | Status: AC
  Filled 2021-10-20: qty 20

## 2021-10-20 MED ORDER — METHOCARBAMOL 500 MG PO TABS
500.0000 mg | ORAL_TABLET | Freq: Four times a day (QID) | ORAL | Status: DC | PRN
  Administered 2021-10-21: 500 mg via ORAL
  Filled 2021-10-20: qty 1

## 2021-10-20 MED ORDER — CARBIDOPA-LEVODOPA 25-100 MG PO TABS
1.0000 | ORAL_TABLET | Freq: Every day | ORAL | Status: DC
  Administered 2021-10-20: 1 via ORAL
  Filled 2021-10-20: qty 1

## 2021-10-20 MED ORDER — BUPIVACAINE-EPINEPHRINE 0.25% -1:200000 IJ SOLN
INTRAMUSCULAR | Status: DC | PRN
  Administered 2021-10-20: 20 mL

## 2021-10-20 MED ORDER — ROCURONIUM BROMIDE 10 MG/ML (PF) SYRINGE
PREFILLED_SYRINGE | INTRAVENOUS | Status: AC
  Filled 2021-10-20: qty 10

## 2021-10-20 MED ORDER — DEXAMETHASONE SODIUM PHOSPHATE 10 MG/ML IJ SOLN
INTRAMUSCULAR | Status: DC | PRN
  Administered 2021-10-20: 10 mg via INTRAVENOUS

## 2021-10-20 MED ORDER — BUPIVACAINE-EPINEPHRINE (PF) 0.25% -1:200000 IJ SOLN
INTRAMUSCULAR | Status: AC
  Filled 2021-10-20: qty 30

## 2021-10-20 MED ORDER — LOSARTAN POTASSIUM-HCTZ 100-12.5 MG PO TABS: 1.0000 | ORAL_TABLET | Freq: Every day | ORAL | Status: DC

## 2021-10-20 MED ORDER — LIDOCAINE 2% (20 MG/ML) 5 ML SYRINGE
INTRAMUSCULAR | Status: DC | PRN
  Administered 2021-10-20: 60 mg via INTRAVENOUS

## 2021-10-20 MED ORDER — ACETAMINOPHEN 500 MG PO TABS
1000.0000 mg | ORAL_TABLET | Freq: Once | ORAL | Status: AC
  Administered 2021-10-20: 1000 mg via ORAL
  Filled 2021-10-20: qty 2

## 2021-10-20 MED ORDER — THROMBIN 20000 UNITS EX SOLR: OROMUCOSAL | Status: DC | PRN

## 2021-10-20 MED ORDER — ONDANSETRON HCL 4 MG PO TABS: 4.0000 mg | ORAL_TABLET | Freq: Four times a day (QID) | ORAL | Status: DC | PRN

## 2021-10-20 MED ORDER — EPHEDRINE 5 MG/ML INJ
INTRAVENOUS | Status: AC
  Filled 2021-10-20: qty 5

## 2021-10-20 MED ORDER — SODIUM CHLORIDE 0.9% FLUSH: 3.0000 mL | INTRAVENOUS | Status: DC | PRN

## 2021-10-20 MED ORDER — DIPHENHYDRAMINE HCL 50 MG/ML IJ SOLN
INTRAMUSCULAR | Status: DC | PRN
  Administered 2021-10-20: 12.5 mg via INTRAVENOUS

## 2021-10-20 MED ORDER — PHENYLEPHRINE 80 MCG/ML (10ML) SYRINGE FOR IV PUSH (FOR BLOOD PRESSURE SUPPORT)
PREFILLED_SYRINGE | INTRAVENOUS | Status: DC | PRN
  Administered 2021-10-20: 80 ug via INTRAVENOUS

## 2021-10-20 MED ORDER — THROMBIN (RECOMBINANT) 20000 UNITS EX SOLR
CUTANEOUS | Status: AC
  Filled 2021-10-20: qty 20000

## 2021-10-20 MED ORDER — POTASSIUM CHLORIDE 20 MEQ PO PACK
20.0000 meq | PACK | Freq: Every day | ORAL | Status: DC
  Administered 2021-10-20 – 2021-10-21 (×2): 20 meq via ORAL
  Filled 2021-10-20 (×2): qty 1

## 2021-10-20 MED ORDER — ONDANSETRON HCL 4 MG/2ML IJ SOLN
INTRAMUSCULAR | Status: DC | PRN
  Administered 2021-10-20: 4 mg via INTRAVENOUS

## 2021-10-20 MED ORDER — SUGAMMADEX SODIUM 200 MG/2ML IV SOLN
INTRAVENOUS | Status: DC | PRN
  Administered 2021-10-20: 200 mg via INTRAVENOUS

## 2021-10-20 MED ORDER — KETAMINE HCL 10 MG/ML IJ SOLN
INTRAMUSCULAR | Status: DC | PRN
  Administered 2021-10-20: 20 mg via INTRAVENOUS
  Administered 2021-10-20: 10 mg via INTRAVENOUS

## 2021-10-20 MED ORDER — LOSARTAN POTASSIUM 50 MG PO TABS
100.0000 mg | ORAL_TABLET | Freq: Every day | ORAL | Status: DC
  Administered 2021-10-20 – 2021-10-21 (×2): 100 mg via ORAL
  Filled 2021-10-20 (×2): qty 2

## 2021-10-20 MED ORDER — MENTHOL 3 MG MT LOZG: 1.0000 | LOZENGE | OROMUCOSAL | Status: DC | PRN

## 2021-10-20 MED ORDER — AMISULPRIDE (ANTIEMETIC) 5 MG/2ML IV SOLN: 10.0000 mg | Freq: Once | INTRAVENOUS | Status: DC | PRN

## 2021-10-20 MED ORDER — DEXAMETHASONE SODIUM PHOSPHATE 10 MG/ML IJ SOLN
INTRAMUSCULAR | Status: AC
  Filled 2021-10-20: qty 1

## 2021-10-20 MED ORDER — HYDROCODONE-ACETAMINOPHEN 5-325 MG PO TABS
ORAL_TABLET | ORAL | Status: AC
  Filled 2021-10-20: qty 1

## 2021-10-20 MED ORDER — SURGIFLO WITH THROMBIN (HEMOSTATIC MATRIX KIT) OPTIME
TOPICAL | Status: DC | PRN
  Administered 2021-10-20: 1 via TOPICAL

## 2021-10-20 MED ORDER — ONDANSETRON HCL 4 MG/2ML IJ SOLN
INTRAMUSCULAR | Status: AC
  Filled 2021-10-20: qty 2

## 2021-10-20 MED ORDER — HYDROCODONE-ACETAMINOPHEN 7.5-325 MG PO TABS
ORAL_TABLET | ORAL | Status: AC
  Filled 2021-10-20: qty 1

## 2021-10-20 MED ORDER — VENLAFAXINE HCL ER 75 MG PO CP24
150.0000 mg | ORAL_CAPSULE | Freq: Every day | ORAL | Status: DC
  Administered 2021-10-20: 150 mg via ORAL
  Filled 2021-10-20: qty 2

## 2021-10-20 MED ORDER — POLYETHYLENE GLYCOL 3350 17 G PO PACK: 17.0000 g | PACK | Freq: Every day | ORAL | Status: DC | PRN

## 2021-10-20 MED ORDER — VANCOMYCIN HCL IN DEXTROSE 1-5 GM/200ML-% IV SOLN
1000.0000 mg | Freq: Once | INTRAVENOUS | Status: AC
  Administered 2021-10-20: 1000 mg via INTRAVENOUS
  Filled 2021-10-20 (×2): qty 200

## 2021-10-20 MED ORDER — HYDROMORPHONE HCL 1 MG/ML IJ SOLN
INTRAMUSCULAR | Status: AC
  Filled 2021-10-20: qty 1

## 2021-10-20 MED ORDER — MIDAZOLAM HCL 2 MG/2ML IJ SOLN
INTRAMUSCULAR | Status: DC | PRN
  Administered 2021-10-20: 2 mg via INTRAVENOUS

## 2021-10-20 SURGICAL SUPPLY — 62 items
ANCHOR SWIFT LOCK IMPLANT
BAG COUNTER SPONGE SURGICOUNT (BAG) IMPLANT
CANISTER SUCT 3000ML PPV (MISCELLANEOUS) ×1 IMPLANT
CLSR STERI-STRIP ANTIMIC 1/2X4 (GAUZE/BANDAGES/DRESSINGS) ×1 IMPLANT
CONTROLLER NEUROSTIM PATIENT (NEUROSURGERY SUPPLIES) IMPLANT
COVER MAYO STAND STRL (DRAPES) ×1 IMPLANT
COVER PROBE W GEL 5X96 (DRAPES) IMPLANT
COVER SURGICAL LIGHT HANDLE (MISCELLANEOUS) ×1 IMPLANT
DRAIN CHANNEL 15F RND FF W/TCR (WOUND CARE) IMPLANT
DRAPE C-ARM 42X72 X-RAY (DRAPES) ×1 IMPLANT
DRAPE SURG 17X23 STRL (DRAPES) ×1 IMPLANT
DRAPE U-SHAPE 47X51 STRL (DRAPES) ×1 IMPLANT
DRSG OPSITE POSTOP 4X6 (GAUZE/BANDAGES/DRESSINGS) ×1 IMPLANT
DURAPREP 26ML APPLICATOR (WOUND CARE) ×1 IMPLANT
ELECT BLADE 4.0 EZ CLEAN MEGAD (MISCELLANEOUS)
ELECT CAUTERY BLADE 6.4 (BLADE) IMPLANT
ELECT PENCIL ROCKER SW 15FT (MISCELLANEOUS) ×1 IMPLANT
ELECT REM PT RETURN 9FT ADLT (ELECTROSURGICAL) ×1
ELECTRODE BLDE 4.0 EZ CLN MEGD (MISCELLANEOUS) IMPLANT
ELECTRODE REM PT RTRN 9FT ADLT (ELECTROSURGICAL) ×1 IMPLANT
GENERATOR PULSE PROCLAIM 5ELIT (Neuro Prosthesis/Implant) IMPLANT
GLOVE BIO SURGEON STRL SZ7 (GLOVE) ×1 IMPLANT
GLOVE BIOGEL PI IND STRL 7.0 (GLOVE) ×1 IMPLANT
GLOVE BIOGEL PI IND STRL 8.5 (GLOVE) ×1 IMPLANT
GLOVE BIOGEL PI INDICATOR 7.0 (GLOVE) ×3
GLOVE BIOGEL PI INDICATOR 8.5 (GLOVE) ×1
GLOVE SS N UNI LF 8.5 STRL (GLOVE) ×1 IMPLANT
GOWN STRL REUS W/ TWL LRG LVL3 (GOWN DISPOSABLE) ×2 IMPLANT
GOWN STRL REUS W/TWL 2XL LVL3 (GOWN DISPOSABLE) ×1 IMPLANT
GOWN STRL REUS W/TWL LRG LVL3 (GOWN DISPOSABLE) ×2
KIT BASIN OR (CUSTOM PROCEDURE TRAY) ×1 IMPLANT
KIT TURNOVER KIT B (KITS) ×1 IMPLANT
LEAD OCTRODE GEN 8CH 60CM (Lead) IMPLANT
MAGNET NEUROSTIM ETERNA PAT (NEUROSURGERY SUPPLIES) IMPLANT
NDL SPNL 18GX3.5 QUINCKE PK (NEEDLE) ×1 IMPLANT
NEEDLE 22X1 1/2 (OR ONLY) (NEEDLE) ×1 IMPLANT
NEEDLE SPNL 18GX3.5 QUINCKE PK (NEEDLE) ×1 IMPLANT
NS IRRIG 1000ML POUR BTL (IV SOLUTION) ×1 IMPLANT
PACK LAMINECTOMY ORTHO (CUSTOM PROCEDURE TRAY) ×1 IMPLANT
PACK UNIVERSAL I (CUSTOM PROCEDURE TRAY) ×1 IMPLANT
PAD ARMBOARD 7.5X6 YLW CONV (MISCELLANEOUS) ×3 IMPLANT
PULSE GENERATOR PROCLAIM 5ELIT (Neuro Prosthesis/Implant) ×1 IMPLANT
SPATULA SILICONE BRAIN 10MM (MISCELLANEOUS) IMPLANT
SPONGE SURGIFOAM ABS GEL 100 (HEMOSTASIS) ×1 IMPLANT
SPONGE T-LAP 18X18 ~~LOC~~+RFID (SPONGE) IMPLANT
SPONGE T-LAP 4X18 ~~LOC~~+RFID (SPONGE) IMPLANT
STAPLER VISISTAT 35W (STAPLE) IMPLANT
SURGIFLO W/THROMBIN 8M KIT (HEMOSTASIS) ×1 IMPLANT
SUT BONE WAX W31G (SUTURE) ×1 IMPLANT
SUT ETHIBOND 2 OS 4 DA (SUTURE) ×1 IMPLANT
SUT MNCRL AB 3-0 PS2 18 (SUTURE) ×2 IMPLANT
SUT MNCRL+ AB 3-0 CT1 36 (SUTURE) IMPLANT
SUT MONOCRYL AB 3-0 CT1 36IN (SUTURE) ×1
SUT VIC AB 1 CT1 18XCR BRD 8 (SUTURE) ×2 IMPLANT
SUT VIC AB 1 CT1 8-18 (SUTURE) ×2
SUT VIC AB 2-0 CT1 18 (SUTURE) ×1 IMPLANT
SYR BULB IRRIG 60ML STRL (SYRINGE) ×1 IMPLANT
SYR CONTROL 10ML LL (SYRINGE) ×1 IMPLANT
TOWEL GREEN STERILE (TOWEL DISPOSABLE) ×1 IMPLANT
TOWEL GREEN STERILE FF (TOWEL DISPOSABLE) ×1 IMPLANT
WATER STERILE IRR 1000ML POUR (IV SOLUTION) ×1 IMPLANT
YANKAUER SUCT BULB TIP NO VENT (SUCTIONS) ×1 IMPLANT

## 2021-10-20 NOTE — Progress Notes (Signed)
Pharmacy Antibiotic Note  Jillian Reid is a 71 y.o. female admitted on 10/20/2021 s/p spinal cord stimulator insertion  Pharmacy has been consulted for vancomycin dosing for surgical prophylaxis. No drain noted, therefore will give one dose 12 hours after preop dose.   Plan: Vancomycin '1000mg'$  IV x 1 at 0400 on 9/1 Pharmacy will sign off.   Height: '5\' 2"'$  (157.5 cm) Weight: 73.9 kg (163 lb) IBW/kg (Calculated) : 50.1  Temp (24hrs), Avg:98.3 F (36.8 C), Min:98.3 F (36.8 C), Max:98.3 F (36.8 C)  No results for input(s): "WBC", "CREATININE", "LATICACIDVEN", "VANCOTROUGH", "VANCOPEAK", "VANCORANDOM", "GENTTROUGH", "GENTPEAK", "GENTRANDOM", "TOBRATROUGH", "TOBRAPEAK", "TOBRARND", "AMIKACINPEAK", "AMIKACINTROU", "AMIKACIN" in the last 168 hours.  CrCl cannot be calculated (Patient's most recent lab result is older than the maximum 21 days allowed.).    Allergies  Allergen Reactions   Codeine Nausea And Vomiting   Penicillins Hives, Itching and Other (See Comments)    Has patient had a PCN reaction causing immediate rash, facial/tongue/throat swelling, SOB or lightheadedness with hypotension: No Has patient had a PCN reaction causing severe rash involving mucus membranes or skin necrosis: No Has patient had a PCN reaction that required hospitalization: No Has patient had a PCN reaction occurring within the last 10 years: No If all of the above answers are "NO", then may proceed with Cephalosporin use.    Oxycodone Rash    Tamon Parkerson A. Levada Dy, PharmD, BCPS, FNKF Clinical Pharmacist [place] Please utilize Amion for appropriate phone number to reach the unit pharmacist ([place])  10/20/2021 6:21 PM

## 2021-10-20 NOTE — Discharge Instructions (Signed)

## 2021-10-20 NOTE — Progress Notes (Signed)
Dr. Doroteo Glassman made aware pt had labs at PCP office last week, reported by pt "they were fine". Labs not collected today.

## 2021-10-20 NOTE — Anesthesia Procedure Notes (Signed)
Procedure Name: Intubation Date/Time: 10/20/2021 3:46 PM  Performed by: Dorthea Cove, CRNAPre-anesthesia Checklist: Patient identified, Emergency Drugs available, Suction available and Patient being monitored Patient Re-evaluated:Patient Re-evaluated prior to induction Oxygen Delivery Method: Circle system utilized Preoxygenation: Pre-oxygenation with 100% oxygen Induction Type: IV induction Ventilation: Mask ventilation without difficulty Laryngoscope Size: Mac and 3 Grade View: Grade I Tube type: Oral Tube size: 7.0 mm Number of attempts: 1 Airway Equipment and Method: Stylet and Oral airway Placement Confirmation: ETT inserted through vocal cords under direct vision, positive ETCO2 and breath sounds checked- equal and bilateral Secured at: 21 cm Tube secured with: Tape Dental Injury: Teeth and Oropharynx as per pre-operative assessment

## 2021-10-20 NOTE — Anesthesia Preprocedure Evaluation (Addendum)
Anesthesia Evaluation  Patient identified by MRN, date of birth, ID band Patient awake    Reviewed: Allergy & Precautions, NPO status , Patient's Chart, lab work & pertinent test results  History of Anesthesia Complications (+) PONV and history of anesthetic complications  Airway Mallampati: II  TM Distance: >3 FB Neck ROM: Full    Dental  (+) Teeth Intact, Dental Advisory Given   Pulmonary asthma ,  Well controlled asthma  Snores at night, had 2 sleep studies that were negative- last one 4 years ago   Pulmonary exam normal breath sounds clear to auscultation       Cardiovascular hypertension (164/79 in preop), Pt. on medications Normal cardiovascular exam Rhythm:Regular Rate:Normal     Neuro/Psych  Headaches, PSYCHIATRIC DISORDERS Anxiety Depression Bipolar Disorder CVA (generalized weakness), Residual Symptoms    GI/Hepatic Neg liver ROS, hiatal hernia, GERD  Controlled,  Endo/Other  Hypothyroidism   Renal/GU negative Renal ROS  negative genitourinary   Musculoskeletal  (+) Arthritis , Osteoarthritis,  Chronic pain- tizanidine   Abdominal   Peds  Hematology negative hematology ROS (+)   Anesthesia Other Findings   Reproductive/Obstetrics negative OB ROS                            Anesthesia Physical Anesthesia Plan  ASA: 3  Anesthesia Plan: General   Post-op Pain Management: Tylenol PO (pre-op)*   Induction: Intravenous  PONV Risk Score and Plan: 4 or greater and Ondansetron, Dexamethasone, Midazolam, Treatment may vary due to age or medical condition and Diphenhydramine  Airway Management Planned: Oral ETT  Additional Equipment: None  Intra-op Plan:   Post-operative Plan: Extubation in OR  Informed Consent: I have reviewed the patients History and Physical, chart, labs and discussed the procedure including the risks, benefits and alternatives for the proposed anesthesia  with the patient or authorized representative who has indicated his/her understanding and acceptance.     Dental advisory given and Consent reviewed with POA  Plan Discussed with: CRNA  Anesthesia Plan Comments: (requesting benadryl w/ any pain meds. Takes hydroxyzine at home for anxiety or itching per pt)      Anesthesia Quick Evaluation

## 2021-10-20 NOTE — Transfer of Care (Signed)
Immediate Anesthesia Transfer of Care Note  Patient: Jillian Reid  Procedure(s) Performed: SPINAL CORD STIMULATOR INSERTION (Spine Thoracic)  Patient Location: PACU  Anesthesia Type:General  Level of Consciousness: awake, alert  and oriented  Airway & Oxygen Therapy: Patient Spontanous Breathing and Patient connected to face mask oxygen  Post-op Assessment: Report given to RN and Post -op Vital signs reviewed and stable  Post vital signs: Reviewed and stable  Last Vitals:  Vitals Value Taken Time  BP 162/75 10/20/21 1730  Temp    Pulse 91 10/20/21 1731  Resp 21 10/20/21 1731  SpO2 96 % 10/20/21 1731  Vitals shown include unvalidated device data.  Last Pain:  Vitals:   10/20/21 1323  TempSrc:   PainSc: 8       Patients Stated Pain Goal: 3 (39/67/28 [id number])  Complications: No notable events documented.

## 2021-10-20 NOTE — Brief Op Note (Signed)
10/20/2021  5:24 PM  PATIENT:  Jillian Reid  71 y.o. female  PRE-OPERATIVE DIAGNOSIS:  Chronic pain syndrome  POST-OPERATIVE DIAGNOSIS:  Chronic pain syndrome  PROCEDURE:  Procedure(s): SPINAL CORD STIMULATOR INSERTION (N/A)  SURGEON:  Surgeon(s) and Role:    Melina Schools, MD - Primary  PHYSICIAN ASSISTANT:   ASSISTANTS: none   ANESTHESIA:   general  EBL:  25 mL   BLOOD ADMINISTERED:none  DRAINS: none   LOCAL MEDICATIONS USED:  MARCAINE     SPECIMEN:  No Specimen  DISPOSITION OF SPECIMEN:  N/A  COUNTS:  YES  TOURNIQUET:  * No tourniquets in log *  DICTATION: .Dragon Dictation  PLAN OF CARE: Admit for overnight observation  PATIENT DISPOSITION:  PACU - hemodynamically stable.

## 2021-10-20 NOTE — H&P (Signed)
History: Jillian Reid presents today after having a successful spinal cord stimulator trial. She states that her pain is debilitating and that she did get temporary significant improvement following the trial. As result she would like to move forward with the permanent implantation.  Past Medical History:  Diagnosis Date   Allergy    Anxiety    Aortic atherosclerosis (HCC)    Asthma    Bipolar disorder (HCC)    Breast cancer ([place])    Left   Carpal tunnel syndrome    Bilateral   Depression    Diarrhea    Dyspnea    GERD (gastroesophageal reflux disease)    Grade I diastolic dysfunction    Heart murmur    History of stomach ulcers    Hypertension    Hypothyroidism    Insomnia    Low back pain    Lumbar radiculopathy, chronic    Migraines    Mild aortic regurgitation    MVP (mitral valve prolapse)    OA (osteoarthritis)    Pericardial effusion    Small   PONV (postoperative nausea and vomiting)    Right hip pain    Seasonal allergies    Spinal stenosis    Spondylosis    Stroke (HCC)    Syncope    Vitamin B 12 deficiency     Allergies  Allergen Reactions   Codeine Nausea And Vomiting   Penicillins Hives, Itching and Other (See Comments)    Has patient had a PCN reaction causing immediate rash, facial/tongue/throat swelling, SOB or lightheadedness with hypotension: No Has patient had a PCN reaction causing severe rash involving mucus membranes or skin necrosis: No Has patient had a PCN reaction that required hospitalization: No Has patient had a PCN reaction occurring within the last 10 years: No If all of the above answers are "NO", then may proceed with Cephalosporin use.    Oxycodone Rash    No current facility-administered medications on file prior to encounter.   Current Outpatient Medications on File Prior to Encounter  Medication Sig Dispense Refill   Biotin w/ Vitamins C & E (HAIR SKIN & NAILS GUMMIES PO) Take 3 tablets by mouth daily.      carbidopa-levodopa (SINEMET IR) 25-100 MG tablet Take 1 tablet by mouth at bedtime. Take additional table     cetirizine (ZYRTEC) 10 MG tablet Take 10 mg by mouth daily in the afternoon.     clonazePAM (KLONOPIN) 1 MG tablet Take 1 mg by mouth at bedtime.     Cromolyn Sodium (NASAL ALLERGY NA) Place 1 spray into the nose daily as needed (Congestion).     EPINEPHrine (PRIMATENE MIST IN) Inhale into the lungs.     EPINEPHrine (PRIMATENE MIST IN) Inhale 1 puff into the lungs daily as needed (Shortness of breath).     ergocalciferol (VITAMIN D2) 1.25 MG (50000 UT) capsule Take 50,000 Units by mouth once a week.     hydrOXYzine (VISTARIL) 25 MG capsule Take 25 mg by mouth 3 (three) times daily as needed for itching or anxiety.     levothyroxine (SYNTHROID) 88 MCG tablet Take 88 mcg by mouth daily before breakfast.     losartan-hydrochlorothiazide (HYZAAR) 100-12.5 MG tablet Take 1 tablet by mouth daily.     olopatadine (PATANOL) 0.1 % ophthalmic solution Place 1 drop into both eyes daily as needed (Itching eyes).     potassium chloride (KLOR-CON) 20 MEQ packet Take 20 mEq by mouth daily.  rosuvastatin (CRESTOR) 40 MG tablet Take 40 mg by mouth at bedtime.     tizanidine (ZANAFLEX) 2 MG capsule Take 2 mg by mouth daily.     tizanidine (ZANAFLEX) 6 MG capsule Take 6 mg by mouth at bedtime.     venlafaxine XR (EFFEXOR-XR) 150 MG 24 hr capsule Take 150 mg by mouth at bedtime.     zolpidem (AMBIEN CR) 12.5 MG CR tablet Take 12.5 mg by mouth at bedtime.     Ascorbic Acid (VITAMIN C) 1000 MG tablet Take 1,000 mg by mouth daily.      Physical Exam: Vitals:   10/20/21 1222  BP: (!) 164/79  Pulse: 65  Resp: 18  Temp: 98.3 F (36.8 C)  SpO2: 96%   Body mass index is 29.81 kg/m.  Clinical exam: Jillian Reid is a pleasant individual, who appears younger than their stated age.  She is alert and orientated 3.  No shortness of breath, chest pain.  Abdomen is soft and non-tender, negative loss of  bowel and bladder control, no rebound tenderness.  Negative: skin lesions abrasions contusions  Peripheral pulses: 2+ peripheral pulses bilaterally. LE compartments are: Soft and nontender.  Gait pattern: Altered gait pattern due to severe back buttock and bilateral neuropathic leg pain  Assistive devices: None  Neuro: Negative Babinski test, no clonus, negative straight leg raise test. 5/5 motor strength in the lower extremity bilaterally. Positive numbness and dysesthesias into both lower extremities. 1+ deep tendon reflexes at the knee absent at the Achilles.  Musculoskeletal: Significant back pain with palpation and range of motion. Pain with forward flexion and extension as well as rotation. No SI joint pain. Patient had temporary significant improvement in her chronic back buttock and neuropathic leg pain following the spinal cord stimulator trial.  Imaging: x-rays of the lumbar spine taken today in the office demonstrate normal sagittal alignment. Mild multilevel degenerative disc disease but no scoliosis or spondylolisthesis.  Thoracic MRI: completed on 10/01/21:  No cord signal changes. There is a slitlike T2 hyperintensity spanning mid T7 to the T8-9 level most likely representing persistent central canal which is a normal anatomical variant. No significant foraminal or central stenosis. No contraindication to spinal cord stimulator implantation.   A/P: Summary: Jillian Reid presents today after having a successful spinal cord stimulator trial. She states that her pain is debilitating and that she did get temporary significant improvement following the trial. As result she would like to move forward with the permanent implantation.  I have gone over the surgical procedure which would be a thoracic laminotomy with implantation of the leads followed by implantation of the battery. I have reviewed the risks, benefits, and alternatives to surgery with her in great detail and she is expressed an  understanding and willingness to move forward with surgery. Risks and benefits of surgery were discussed with the patient. These include: Infection, bleeding, death, stroke, paralysis, ongoing or worse pain, need for additional surgery, leak of spinal fluid, Failure of the battery requiring reoperation. Inability to place the paddle requiring the surgery to be aborted. Migration of the lead, failure to obtain results similar to the trial.  We will obtain preoperative medical clearance from her primary care physician and move forward with surgery in a timely fashion. I did discuss with her how on her thoracic MRI they have noted dilated common bile ducts up to 15 mm. I have given her a copy of this report to bring to her primary care physician to determine if further work-up  is necessary in the future.

## 2021-10-20 NOTE — Op Note (Signed)
OPERATIVE REPORT  DATE OF SURGERY: 10/20/2021  PATIENT NAME:  Jillian Reid MRN: [id number] DOB: 1951/02/09  PCP: Lonia Mad, MD  PRE-OPERATIVE DIAGNOSIS: Chronic pain syndrome.  Status post successful spinal cord stimulator trial.  POST-OPERATIVE DIAGNOSIS: Same  PROCEDURE:   Implantation of spinal cord stimulator  SURGEON:  Melina Schools, MD  PHYSICIAN ASSISTANT: None  ANESTHESIA:   General  EBL: 25 ml   Complications: None  Implants: Abbott spine: Proclaim XR generator. Octrode lead x 2  BRIEF HISTORY: Jillian Reid is a 71 y.o. female who had chronic back pain.  She has had prior spinal surgery but unfortunately her quality of life never improved.  She continues to have significant back buttock and neuropathic leg pain.  She recently had a spinal cord stimulator trial and noted improvement in her pain and quality of life.  As result she elected to move forward with permanent implantation.  All appropriate risks, benefits, and alternatives to surgery were discussed with patient and consent was obtained.  PROCEDURE DETAILS: Patient was brought into the operating room and was properly positioned on the operating room table.  After induction with general anesthesia the patient was endotracheally intubated.  A timeout was taken to confirm all important data: including patient, procedure, and the level. Teds, SCD's were applied.   Patient was turned prone onto the Wilson frame and all bony prominences well-padded.  The thoracolumbar spine was then prepped and draped in standard fashion.  Both incision sites were infiltrated with quarter percent Marcaine with epinephrine.  The thoracic incision was made and sharp dissection was carried out down to the deep fascia I incised the deep fascia and stripped the paraspinal muscles to expose the T10 and T11 spinous processes and the lamina.  X-ray was then used to confirm that I was at the appropriate level.  Once this was confirmed a T10  laminotomy was performed with a Kerrison rongeur.  The inferior third of the spinous process of T10 was removed with a Leksell rongeur.  I then used a Penfield 4 to dissect through the ligamentum flavum and resected the ligamentum flavum with a 1 mm and 2 mm Kerrison rongeur.  I now could visualize the dorsal surface of the thecal sac.  The lead was then gently passed until it came to rest in the midportion of T8.  This was in the midline.  A second lead was passed and was just to the left of the first.  Both leads were properly positioned in both the AP and lateral views.  Presented from Abbott spine confirmed that the leads were properly positioned and she can get adequate coverage.  The leads were then secured directly to the T11 spinous process with an Ethibond suture.  This was tied through the bone to secure it in place. I then passed the leads through the T11-12 interspinous process space wrapped around to further prevent migration.  The battery incision site was made and a pocket was created approximately 2 and half centimeters deep.  Using the submuscular passer I advanced the leads from the thoracic wound to the gluteal wound.  At this point both wounds were copiously irrigated with normal saline and made sure hemostasis using bipolar electrocautery.  I then disconnected the Bovie and bipolar and brought the battery on the field.  The leads were secured into the battery and tightened according manufacture standards.  The lead the battery was then placed into the gluteal incision site and the excess lead was wrapped underneath it.  The battery was then secured to the deep fascia with two #1 Vicryl sutures.  The system was then tested by the representative and was noted to be functioning.  Both wounds were then copiously irrigated with normal saline.  The deep fascia of both wounds were closed with interrupted #1 Vicryl suture.  Then a layer of 2-0 Vicryl suture and finally 3-0 Monocryl for the skin.   Final x-rays demonstrate satisfactory position of the leads in both the AP and lateral planes.  Sterile dry dressings were applied and the patient was extubated transfer the PACU without incident.  The end of the case all needle sponge counts were correct.  There were no adverse intraoperative events.  Melina Schools, MD 10/20/2021 5:18 PM

## 2021-10-21 ENCOUNTER — Encounter (HOSPITAL_COMMUNITY): Payer: Self-pay | Admitting: Orthopedic Surgery

## 2021-10-21 DIAGNOSIS — G894 Chronic pain syndrome: Secondary | ICD-10-CM | POA: Diagnosis not present

## 2021-10-21 MED ORDER — HYDROXYZINE HCL 25 MG PO TABS
25.0000 mg | ORAL_TABLET | ORAL | Status: DC | PRN
  Administered 2021-10-21: 25 mg via ORAL
  Filled 2021-10-21: qty 1

## 2021-10-21 MED ORDER — HYDROXYZINE HCL 25 MG PO TABS
25.0000 mg | ORAL_TABLET | Freq: Four times a day (QID) | ORAL | Status: DC | PRN
  Administered 2021-10-21: 25 mg via ORAL
  Filled 2021-10-21: qty 1

## 2021-10-21 MED ORDER — OXYCODONE-ACETAMINOPHEN 10-325 MG PO TABS: 1.0000 | ORAL_TABLET | Freq: Four times a day (QID) | ORAL | 0 refills | Status: AC | PRN

## 2021-10-21 MED FILL — Thrombin (Recombinant) For Soln 20000 Unit: CUTANEOUS | Qty: 1 | Status: AC

## 2021-10-21 NOTE — Progress Notes (Signed)
PT Cancellation Note and Discharge  Patient Details Name: Jillian Reid MRN: [id number] DOB: 1950/09/26   Cancelled Treatment:    Reason Eval/Treat Not Completed: PT screened, no needs identified, will sign off. Discussed pt case with OT who reports pt is currently mobilizing well without assistance and does not require a formal PT evaluation at this time. PT signing off. If needs change, please reconsult.     Thelma Comp 10/21/2021, 9:25 AM  Rolinda Roan, PT, DPT Acute Rehabilitation Services Secure Chat Preferred Office: [phone]

## 2021-10-21 NOTE — Progress Notes (Signed)
Patient alert and oriented, incision clean,dry no sign of infection. D/c instruction explain to the patient and daughter, copy of instruction given.Patient d/c per order.

## 2021-10-21 NOTE — Plan of Care (Signed)
  Problem: Skin Integrity: Goal: Risk for impaired skin integrity will decrease Outcome: Completed/Met   Problem: Education: Goal: Ability to verbalize activity precautions or restrictions will improve Outcome: Completed/Met Goal: Knowledge of the prescribed therapeutic regimen will improve Outcome: Completed/Met Goal: Understanding of discharge needs will improve Outcome: Completed/Met   Problem: Activity: Goal: Ability to avoid complications of mobility impairment will improve Outcome: Completed/Met Goal: Ability to tolerate increased activity will improve Outcome: Completed/Met Goal: Will remain free from falls Outcome: Completed/Met   Problem: Bowel/Gastric: Goal: Gastrointestinal status for postoperative course will improve Outcome: Completed/Met   Problem: Clinical Measurements: Goal: Ability to maintain clinical measurements within normal limits will improve Outcome: Completed/Met Goal: Postoperative complications will be avoided or minimized Outcome: Completed/Met Goal: Diagnostic test results will improve Outcome: Completed/Met   Problem: Pain Management: Goal: Pain level will decrease Outcome: Completed/Met   Problem: Skin Integrity: Goal: Will show signs of wound healing Outcome: Completed/Met   Problem: Health Behavior/Discharge Planning: Goal: Identification of resources available to assist in meeting health care needs will improve Outcome: Completed/Met   Problem: Bladder/Genitourinary: Goal: Urinary functional status for postoperative course will improve Outcome: Completed/Met   

## 2021-10-21 NOTE — Care Management Obs Status (Signed)
[facility] NOTIFICATION   Patient Details  Name: Jillian Reid MRN: [id number] Date of Birth: 12-13-1950   Medicare Observation Status Notification Given:  Yes    Ella Bodo, RN 10/21/2021, 9:57 AM

## 2021-10-21 NOTE — Evaluation (Signed)
Occupational Therapy Evaluation Patient Details Name: Jillian Reid MRN: [id number] DOB: June 05, 1950 Today's Date: 10/21/2021   History of Present Illness 71 yo female s/p spinal stimulator insertion on 8/31. PMH including bipolar, carpal tunnel syndrome, HTN, heart murmur, stroke, and hypothyroidism.   Clinical Impression   PTA, pt was living with her husband and was independent; daughter plans on staying at dc. Currently, pt requires Supervision for ADLs and functional mobility; Mod A for LB ADLs due to limited ROM. Provided education and handout on back precautions, bed mobility, grooming, LB ADLs with AE, toileting, and shower transfer; pt demonstrated understanding. Answered all pt questions. Recommend dc home once medically stable per physician. All acute OT needs met and will sign off. Thank you.    Recommendations for follow up therapy are one component of a multi-disciplinary discharge planning process, led by the attending physician.  Recommendations may be updated based on patient status, additional functional criteria and insurance authorization.   Follow Up Recommendations  Other (comment) (OP PT once cleared by physician)    Assistance Recommended at Discharge Frequent or constant Supervision/Assistance  Patient can return home with the following      Functional Status Assessment  Patient has had a recent decline in their functional status and demonstrates the ability to make significant improvements in function in a reasonable and predictable amount of time.  Equipment Recommendations  None recommended by OT    Recommendations for Other Services       Precautions / Restrictions Precautions Precautions: Other (comment);Back (spinal stimulator) Precaution Booklet Issued: Yes (comment) Required Braces or Orthoses: Other Brace Other Brace: No brace Restrictions Weight Bearing Restrictions: No      Mobility Bed Mobility Overal bed mobility: Needs Assistance Bed  Mobility: Rolling, Sidelying to Sit Rolling: Supervision Sidelying to sit: Supervision       General bed mobility comments: Supervision for safety    Transfers Overall transfer level: Needs assistance Equipment used: None Transfers: Sit to/from Stand Sit to Stand: Supervision           General transfer comment: Supervision for safety      Balance Overall balance assessment: No apparent balance deficits (not formally assessed)                                         ADL either performed or assessed with clinical judgement   ADL Overall ADL's : Needs assistance/impaired Eating/Feeding: Set up;Sitting   Grooming: Wash/dry hands;Supervision/safety;Standing   Upper Body Bathing: Set up;Sitting   Lower Body Bathing: Supervison/ safety;Sit to/from stand   Upper Body Dressing : Supervision/safety;Set up;Sitting   Lower Body Dressing: Moderate assistance;Sit to/from stand Lower Body Dressing Details (indicate cue type and reason): Educating on use of reacher for LB dressing. Toilet Transfer: Supervision/safety;Ambulation;Regular Toilet   Toileting- Water quality scientist and Hygiene: Supervision/safety;Sitting/lateral lean;Sit to/from stand       Functional mobility during ADLs: Supervision/safety;Min guard General ADL Comments: Providing education on back precautions, grooming, LB ADLs with AE, toileting, and shower transfer     Vision Baseline Vision/History: 1 Wears glasses       Perception     Praxis      Pertinent Vitals/Pain Pain Assessment Pain Assessment: Faces Faces Pain Scale: Hurts little more Pain Location: Back Pain Descriptors / Indicators: Discomfort Pain Intervention(s): Monitored during session, Repositioned     Hand Dominance     Extremity/Trunk Assessment Upper Extremity  Assessment Upper Extremity Assessment: Overall WFL for tasks assessed   Lower Extremity Assessment Lower Extremity Assessment: Overall WFL for  tasks assessed   Cervical / Trunk Assessment Cervical / Trunk Assessment: Back Surgery   Communication Communication Communication: No difficulties   Cognition Arousal/Alertness: Awake/alert Behavior During Therapy: WFL for tasks assessed/performed Overall Cognitive Status: Within Functional Limits for tasks assessed                                       General Comments  Daughter present throughout session    Exercises     Shoulder Instructions      Home Living Family/patient expects to be discharged to:: Private residence Living Arrangements: Spouse/significant other Available Help at Discharge: Family;Available 24 hours/day (daughter plans to stay at dc) Type of Home: House Home Access: Level entry     Home Layout: Able to live on main level with bedroom/bathroom     Bathroom Shower/Tub: Occupational psychologist: Handicapped height     Home Equipment: [place] - single point;Shower seat          Prior Functioning/Environment Prior Level of Function : Independent/Modified Independent;History of Falls (last six months);Needs assist               ADLs Comments: Occasional assist for LB dressing        OT Problem List: Decreased strength;Decreased range of motion;Decreased activity tolerance;Impaired balance (sitting and/or standing);Decreased knowledge of use of DME or AE;Decreased knowledge of precautions;Pain      OT Treatment/Interventions:      OT Goals(Current goals can be found in the care plan section) Acute Rehab OT Goals Patient Stated Goal: Go home OT Goal Formulation: All assessment and education complete, DC therapy  OT Frequency:      Co-evaluation              AM-PAC OT "6 Clicks" Daily Activity     Outcome Measure Help from another person eating meals?: None Help from another person taking care of personal grooming?: None Help from another person toileting, which includes using toliet, bedpan, or urinal?:  A Little Help from another person bathing (including washing, rinsing, drying)?: A Little Help from another person to put on and taking off regular upper body clothing?: A Little Help from another person to put on and taking off regular lower body clothing?: A Lot 6 Click Score: 19   End of Session Nurse Communication: Mobility status  Activity Tolerance: Patient tolerated treatment well Patient left: in chair;with call bell/phone within reach;with family/visitor present  OT Visit Diagnosis: Muscle weakness (generalized) (M62.81);Pain                Time: 6761-9509 OT Time Calculation (min): 26 min Charges:  OT General Charges $OT Visit: 1 Visit OT Evaluation $OT Eval Low Complexity: 1 Low OT Treatments $Self Care/Home Management : 8-22 mins  Abram Sax MSOT, OTR/L Acute Rehab Office: [place] 10/21/2021, 9:44 AM

## 2021-10-21 NOTE — Discharge Summary (Signed)
Patient ID: Jillian Reid MRN: [id number] DOB/AGE: 06-21-1950 71 y.o.  Admit date: 10/20/2021 Discharge date: 10/21/2021  Admission Diagnoses:  Principal Problem:   Chronic pain   Discharge Diagnoses:  Principal Problem:   Chronic pain  status post Procedure(s): SPINAL CORD STIMULATOR INSERTION  Past Medical History:  Diagnosis Date   Allergy    Anxiety    Aortic atherosclerosis (HCC)    Asthma    Bipolar disorder ([place])    Breast cancer ([place])    Left   Carpal tunnel syndrome    Bilateral   Depression    Diarrhea    Dyspnea    GERD (gastroesophageal reflux disease)    Grade I diastolic dysfunction    Heart murmur    History of stomach ulcers    Hypertension    Hypothyroidism    Insomnia    Low back pain    Lumbar radiculopathy, chronic    Migraines    Mild aortic regurgitation    MVP (mitral valve prolapse)    OA (osteoarthritis)    Pericardial effusion    Small   PONV (postoperative nausea and vomiting)    Right hip pain    Seasonal allergies    Spinal stenosis    Spondylosis    Stroke ([employer])    Syncope    Vitamin B 12 deficiency     Surgeries: Procedure(s): SPINAL CORD STIMULATOR INSERTION on 10/20/2021   Consultants:   Discharged Condition: Improved  Hospital Course: Jillian Reid is an 71 y.o. female who was admitted 10/20/2021 for operative treatment of Chronic pain. Patient failed conservative treatments (please see the history and physical for the specifics) and had severe unremitting pain that affects sleep, daily activities and work/hobbies. After pre-op clearance, the patient was taken to the operating room on 10/20/2021 and underwent  Procedure(s): [place].    Patient was given perioperative antibiotics:  Anti-infectives (From admission, onward)    Start     Dose/Rate Route Frequency Ordered Stop   10/21/21 0400  vancomycin (VANCOCIN) IVPB 1000 mg/200 mL premix        1,000 mg 200 mL/hr over 60 Minutes Intravenous   Once 10/20/21 1823 10/21/21 0441   10/20/21 1330  vancomycin (VANCOCIN) IVPB 1000 mg/200 mL premix        1,000 mg 200 mL/hr over 60 Minutes Intravenous  Once 10/20/21 1317 10/20/21 1654        Patient was given sequential compression devices and early ambulation to prevent DVT.   Patient benefited maximally from hospital stay and there were no complications. At the time of discharge, the patient was urinating/moving their bowels without difficulty, tolerating a regular diet, pain is controlled with oral pain medications and they have been cleared by PT/OT.   Recent vital signs: Patient Vitals for the past 24 hrs:  BP Temp Temp src Pulse Resp SpO2 Height Weight  10/21/21 0846 (!) 160/78 98.9 F (37.2 C) Oral 86 16 95 % -- --  10/21/21 0347 (!) 167/69 98.4 F (36.9 C) Oral 72 18 93 % -- --  10/20/21 2307 (!) 172/76 98.7 F (37.1 C) Oral 92 18 94 % -- --  10/20/21 1940 (!) 177/83 98.4 F (36.9 C) Oral 86 18 97 % -- --  10/20/21 1837 (!) 173/73 -- -- 84 18 95 % -- --  10/20/21 1815 (!) 161/95 98 F (36.7 C) -- 79 (!) 9 93 % -- --  10/20/21 1800 (!) 153/60 -- -- 86 17 95 % -- --  10/20/21 1745 (!) 162/80 -- -- 82 12 97 % -- --  10/20/21 1730 (!) 162/75 98 F (36.7 C) -- 91 16 97 % -- --  10/20/21 1222 (!) 164/79 98.3 F (36.8 C) Oral 65 18 96 % '5\' 2"'$  (1.575 m) 73.9 kg     Recent laboratory studies: No results for input(s): "WBC", "HGB", "HCT", "PLT", "NA", "K", "CL", "CO2", "BUN", "CREATININE", "GLUCOSE", "INR", "CALCIUM" in the last 72 hours.  Invalid input(s): "PT", "2"   Discharge Medications:   Allergies as of 10/21/2021       Reactions   Codeine Nausea And Vomiting   Penicillins Hives, Itching, Other (See Comments)   Has patient had a PCN reaction causing immediate rash, facial/tongue/throat swelling, SOB or lightheadedness with hypotension: No Has patient had a PCN reaction causing severe rash involving mucus membranes or skin necrosis: No Has patient had a PCN reaction  that required hospitalization: No Has patient had a PCN reaction occurring within the last 10 years: No If all of the above answers are "NO", then may proceed with Cephalosporin use.   Oxycodone Rash        Medication List     TAKE these medications    Ambien CR 12.5 MG CR tablet Generic drug: zolpidem Take 12.5 mg by mouth at bedtime.   carbidopa-levodopa 25-100 MG tablet Commonly known as: SINEMET IR Take 1 tablet by mouth at bedtime. Take additional table   cetirizine 10 MG tablet Commonly known as: ZYRTEC Take 10 mg by mouth daily in the afternoon.   clonazePAM 1 MG tablet Commonly known as: KLONOPIN Take 1 mg by mouth at bedtime.   ergocalciferol 1.25 MG (50000 UT) capsule Commonly known as: VITAMIN D2 Take 50,000 Units by mouth once a week.   HAIR SKIN & NAILS GUMMIES PO Take 3 tablets by mouth daily.   HYDROcodone-acetaminophen 10-325 MG tablet Commonly known as: Norco Take 1 tablet by mouth every 6 (six) hours as needed for up to 5 days.   hydrOXYzine 25 MG capsule Commonly known as: VISTARIL Take 25 mg by mouth 3 (three) times daily as needed for itching or anxiety.   levothyroxine 88 MCG tablet Commonly known as: SYNTHROID Take 88 mcg by mouth daily before breakfast.   losartan-hydrochlorothiazide 100-12.5 MG tablet Commonly known as: HYZAAR Take 1 tablet by mouth daily.   NASAL ALLERGY NA Place 1 spray into the nose daily as needed (Congestion).   olopatadine 0.1 % ophthalmic solution Commonly known as: PATANOL Place 1 drop into both eyes daily as needed (Itching eyes).   ondansetron 4 MG tablet Commonly known as: Zofran Take 1 tablet (4 mg total) by mouth every 8 (eight) hours as needed for nausea or vomiting.   potassium chloride 20 MEQ packet Commonly known as: KLOR-CON Take 20 mEq by mouth daily.   PRIMATENE MIST IN Inhale into the lungs.   PRIMATENE MIST IN Inhale 1 puff into the lungs daily as needed (Shortness of breath).    rosuvastatin 40 MG tablet Commonly known as: CRESTOR Take 40 mg by mouth at bedtime.   tizanidine 2 MG capsule Commonly known as: ZANAFLEX Take 2 mg by mouth daily. What changed: Another medication with the same name was removed. Continue taking this medication, and follow the directions you see here.   venlafaxine XR 150 MG 24 hr capsule Commonly known as: EFFEXOR-XR Take 150 mg by mouth at bedtime.   vitamin C 1000 MG tablet Take 1,000 mg by mouth daily.  Diagnostic Studies: DG Lumbar Spine 2-3 Views  Result Date: 10/20/2021 CLINICAL DATA:  Spinal cord stimulator insertion. EXAM: LUMBAR SPINE - 2-3 VIEW COMPARISON:  Lateral view of the lumbar spine 09/11/2017; CT lumbar spine 08/28/2017 FINDINGS: Images were performed intraoperatively without the presence of a radiologist. Single lateral view of the spine demonstrates spinal cord stimulator electrodes overlying the posterior aspect of the central canal of the presumably lower thoracic spine. Total fluoroscopy images: 1 Total fluoroscopy time: 24 seconds Total dose: Radiation Exposure Index (as provided by the fluoroscopic device): 12.85 mGy air Kerma Please see intraoperative findings for further detail. IMPRESSION: Intraoperative fluoroscopy for spinal cord stimulator insertion. Electronically Signed   By: Yvonne Kendall M.D.   On: 10/20/2021 17:12   DG C-Arm 1-60 Min-No Report  Result Date: 10/20/2021 Fluoroscopy was utilized by the requesting physician.  No radiographic interpretation.    Discharge Instructions     Incentive spirometry RT   Complete by: As directed         Follow-up Information     Melina Schools, MD Follow up in 2 week(s).   Specialty: Orthopedic Surgery Why: If symptoms worsen, For suture removal, For wound re-check Contact information: [address] [place] [address] [phone]                 Discharge Plan:  discharge to home  Disposition: Patient is  status post spinal cord stimulator implantation.  She is ambulating and voiding spontaneously without difficulty.  Dressings are clean dry and intact.  She remains neurologically intact with no deficits.  At this point I am pleased with her recovery.  She is tolerating a regular diet and she is ready to be discharged home.  She has been given a prescription for hydrocodone and Zofran.  Discharge instructions have been provided.  She will follow-up with me in 2 weeks for reevaluation.  If any issues or problems arise she knows she can contact me and I will see her sooner.    Signed: Dahlia Bailiff for Dr. Melina Schools [facility] [phone] 10/21/2021, 9:15 AM

## 2021-10-24 NOTE — Anesthesia Postprocedure Evaluation (Signed)
Anesthesia Post Note  Patient: Jillian Reid  Procedure(s) Performed: SPINAL CORD STIMULATOR INSERTION (Spine Thoracic)     Patient location during evaluation: PACU Anesthesia Type: General Level of consciousness: awake and alert Pain management: pain level controlled Vital Signs Assessment: post-procedure vital signs reviewed and stable Respiratory status: spontaneous breathing, nonlabored ventilation, respiratory function stable and patient connected to nasal cannula oxygen Cardiovascular status: blood pressure returned to baseline and stable Postop Assessment: no apparent nausea or vomiting Anesthetic complications: no   No notable events documented.  Last Vitals:  Vitals:   10/21/21 0347 10/21/21 0846  BP: (!) 167/69 (!) 160/78  Pulse: 72 86  Resp: 18 16  Temp: 36.9 C 37.2 C  SpO2: 93% 95%    Last Pain:  Vitals:   10/21/21 0846  TempSrc: Oral  PainSc:                  March Rummage Caden Fukushima

## 2022-01-24 ENCOUNTER — Ambulatory Visit (HOSPITAL_COMMUNITY): Payer: Self-pay | Admitting: Orthopedic Surgery

## 2022-02-01 ENCOUNTER — Other Ambulatory Visit (HOSPITAL_COMMUNITY): Payer: Medicare Other

## 2022-02-01 NOTE — Pre-Procedure Instructions (Signed)
Surgical Instructions    Your procedure is scheduled on Monday, December 18th.  Report to [facility] Main Entrance "A" at 10:50 A.M., then check in with the Admitting office.  Call this number if you have problems the morning of surgery:  [phone]   If you have any questions prior to your surgery date call [phone]: Open Monday-Friday 8am-4pm    Remember:  Do not eat after midnight the night before your surgery  You may drink clear liquids until 09:50 AM the morning of your surgery.   Clear liquids allowed are: Water, Non-Citrus Juices (without pulp), Carbonated Beverages, Clear Tea, Black Coffee Only (NO MILK, CREAM OR POWDERED CREAMER of any kind), and Gatorade.    Take these medicines the morning of surgery with A SIP OF WATER  carbidopa-levodopa (SINEMET IR)  levothyroxine (SYNTHROID)    If needed: acetaminophen (TYLENOL)  hydrOXYzine (ATARAX)  olopatadine (PATANOL) 0.1 % ophthalmic solution  oxyCODONE-acetaminophen (PERCOCET)  tiZANidine (ZANAFLEX)    As of today, STOP taking any Aspirin (unless otherwise instructed by your surgeon) Aleve, Naproxen, Ibuprofen, Motrin, Advil, Goody's, BC's, all herbal medications, fish oil, and all vitamins.                     Do NOT Smoke (Tobacco/Vaping) for 24 hours prior to your procedure.  If you use a CPAP at night, you may bring your mask/headgear for your overnight stay.   Contacts, glasses, piercing's, hearing aid's, dentures or partials may not be worn into surgery, please bring cases for these belongings.    For patients admitted to the hospital, discharge time will be determined by your treatment team.   Patients discharged the day of surgery will not be allowed to drive home, and someone needs to stay with them for 24 hours.  SURGICAL WAITING ROOM VISITATION Patients having surgery or a procedure may have no more than 2 support people in the waiting area - these visitors may rotate.   Children under the age of 38  must have an adult with them who is not the patient. If the patient needs to stay at the hospital during part of their recovery, the visitor guidelines for inpatient rooms apply. Pre-op nurse will coordinate an appropriate time for 1 support person to accompany patient in pre-op.  This support person may not rotate.   Please refer to the [facility] website for the visitor guidelines for Inpatients (after your surgery is over and you are in a regular room).    Special instructions:   [facility]- Preparing For Surgery  Before surgery, you can play an important role. Because skin is not sterile, your skin needs to be as free of germs as possible. You can reduce the number of germs on your skin by washing with CHG (chlorahexidine gluconate) Soap before surgery.  CHG is an antiseptic cleaner which kills germs and bonds with the skin to continue killing germs even after washing.    Oral Hygiene is also important to reduce your risk of infection.  Remember - BRUSH YOUR TEETH THE MORNING OF SURGERY WITH YOUR REGULAR TOOTHPASTE  Please do not use if you have an allergy to CHG or antibacterial soaps. If your skin becomes reddened/irritated stop using the CHG.  Do not shave (including legs and underarms) for at least 48 hours prior to first CHG shower. It is OK to shave your face.  Please follow these instructions carefully.   Shower the NIGHT BEFORE SURGERY and the MORNING OF SURGERY  If you chose to wash your hair, wash your hair first as usual with your normal shampoo.  After you shampoo, rinse your hair and body thoroughly to remove the shampoo.  Use CHG Soap as you would any other liquid soap. You can apply CHG directly to the skin and wash gently with a scrungie or a clean washcloth.   Apply the CHG Soap to your body ONLY FROM THE NECK DOWN.  Do not use on open wounds or open sores. Avoid contact with your eyes, ears, mouth and genitals (private parts). Wash Face and genitals (private parts)   with your normal soap.   Wash thoroughly, paying special attention to the area where your surgery will be performed.  Thoroughly rinse your body with warm water from the neck down.  DO NOT shower/wash with your normal soap after using and rinsing off the CHG Soap.  Pat yourself dry with a CLEAN TOWEL.  Wear CLEAN PAJAMAS to bed the night before surgery  Place CLEAN SHEETS on your bed the night before your surgery  DO NOT SLEEP WITH PETS.   Day of Surgery: Take a shower with CHG soap. Do not wear jewelry or makeup Do not wear lotions, powders, perfumes, or deodorant. Do not shave 48 hours prior to surgery.   Do not bring valuables to the hospital. [facility] is not responsible for any belongings or valuables. Do not wear nail polish, gel polish, artificial nails, or any other type of covering on natural nails (fingers and toes) If you have artificial nails or gel coating that need to be removed by a nail salon, please have this removed prior to surgery. Artificial nails or gel coating may interfere with anesthesia's ability to adequately monitor your vital signs. Wear Clean/Comfortable clothing the morning of surgery Remember to brush your teeth WITH YOUR REGULAR TOOTHPASTE.   Please read over the following fact sheets that you were given.    If you received a COVID test during your pre-op visit  it is requested that you wear a mask when out in public, stay away from anyone that may not be feeling well and notify your surgeon if you develop symptoms. If you have been in contact with anyone that has tested positive in the last 10 days please notify you surgeon.

## 2022-02-02 ENCOUNTER — Encounter (HOSPITAL_COMMUNITY): Payer: Self-pay

## 2022-02-02 ENCOUNTER — Other Ambulatory Visit: Payer: Self-pay

## 2022-02-02 ENCOUNTER — Encounter (HOSPITAL_COMMUNITY)
Admission: RE | Admit: 2022-02-02 | Discharge: 2022-02-02 | Disposition: A | Discharge status: Home / Self Care | Payer: Medicare Other | Source: Ambulatory Visit | Attending: Orthopedic Surgery | Admitting: Orthopedic Surgery

## 2022-02-02 VITALS — BP 136/67 | HR 65 | Temp 98.2°F | Resp 18 | Ht 62.0 in | Wt 158.5 lb

## 2022-02-02 DIAGNOSIS — K769 Liver disease, unspecified: Secondary | ICD-10-CM | POA: Diagnosis not present

## 2022-02-02 DIAGNOSIS — Z01818 Encounter for other preprocedural examination: Secondary | ICD-10-CM

## 2022-02-02 DIAGNOSIS — Z01812 Encounter for preprocedural laboratory examination: Secondary | ICD-10-CM | POA: Insufficient documentation

## 2022-02-02 HISTORY — DX: Fatty (change of) liver, not elsewhere classified: K76.0

## 2022-02-02 LAB — COMPREHENSIVE METABOLIC PANEL
ALT: 22 U/L (ref 0–44)
AST: 25 U/L (ref 15–41)
Albumin: 3.7 g/dL (ref 3.5–5.0)
Alkaline Phosphatase: 61 U/L (ref 38–126)
Anion gap: 9 (ref 5–15)
BUN: 18 mg/dL (ref 8–23)
CO2: 30 mmol/L (ref 22–32)
Calcium: 9.8 mg/dL (ref 8.9–10.3)
Chloride: 105 mmol/L (ref 98–111)
Creatinine, Ser: 1.09 mg/dL — ABNORMAL HIGH (ref 0.44–1.00)
GFR, Estimated: 54 mL/min — ABNORMAL LOW (ref >60)
Glucose, Bld: 110 mg/dL — ABNORMAL HIGH (ref 70–99)
Potassium: 4.3 mmol/L (ref 3.5–5.1)
Sodium: 144 mmol/L (ref 135–145)
Total Bilirubin: 0.3 mg/dL (ref 0.3–1.2)
Total Protein: 6.6 g/dL (ref 6.5–8.1)

## 2022-02-02 LAB — CBC
HCT: 43.8 % (ref 36.0–46.0)
Hemoglobin: 14.8 g/dL (ref 12.0–15.0)
MCH: 30.2 pg (ref 26.0–34.0)
MCHC: 33.8 g/dL (ref 30.0–36.0)
MCV: 89.4 fL (ref 80.0–100.0)
Platelets: 207 10*3/uL (ref 150–400)
RBC: 4.9 MIL/uL (ref 3.87–5.11)
RDW: 13.2 % (ref 11.5–15.5)
WBC: 7.4 10*3/uL (ref 4.0–10.5)
nRBC: 0 % (ref 0.0–0.2)

## 2022-02-02 LAB — SURGICAL PCR SCREEN
MRSA, PCR: NEGATIVE
Staphylococcus aureus: NEGATIVE

## 2022-02-02 NOTE — Progress Notes (Addendum)
PCP - [facility]- Mady Gemma, NP Cardiologist - denies Nephrologist- Dr. Alphonsa Gin  PPM/ICD - denies  Chest x-ray - N/A EKG - 10/20/21 Stress Test - denies ECHO - 08/10/17 in media tab in epic Cardiac Cath - denies  Sleep Study - denies CPAP - denies  Blood Thinner Instructions: N/A Aspirin Instructions:N/A  ERAS Protcol -ERAS per protocol   COVID TEST- N/A   Anesthesia review: follow up requested records. Pt has heart murmur, requested office note from PCP and nephrologist.   Fax sent to PCP to get records. Unable to reach- I left a voicemail with [facility] Dr Alphonsa Gin to send last office note and any recent lab work as well as to call back with Fax number.   Patient denies shortness of breath, fever, cough and chest pain at PAT appointment   All instructions explained to the patient, with a verbal understanding of the material. Patient agrees to go over the instructions while at home for a better understanding. The opportunity to ask questions was provided.

## 2022-02-03 NOTE — Anesthesia Preprocedure Evaluation (Signed)
Anesthesia Evaluation  Patient identified by MRN, date of birth, ID band Patient awake    Reviewed: Allergy & Precautions, NPO status , Patient's Chart, lab work & pertinent test results  History of Anesthesia Complications (+) PONV and history of anesthetic complications  Airway Mallampati: II  TM Distance: >3 FB Neck ROM: Full    Dental  (+) Dental Advisory Given, Missing   Pulmonary asthma    Pulmonary exam normal        Cardiovascular hypertension, Pt. on medications Normal cardiovascular exam     Neuro/Psych  Headaches PSYCHIATRIC DISORDERS Anxiety Depression Bipolar Disorder    Neuromuscular disease CVA, No Residual Symptoms    GI/Hepatic Neg liver ROS, hiatal hernia,GERD  Controlled,,  Endo/Other  Hypothyroidism    Renal/GU negative Renal ROS     Musculoskeletal  (+) Arthritis ,    Abdominal   Peds  Hematology negative hematology ROS (+)   Anesthesia Other Findings Painful spinal cord stimulator battery  Reproductive/Obstetrics  Breast cancer s/p hysterectomy                              Anesthesia Physical Anesthesia Plan  ASA: 3  Anesthesia Plan: General   Post-op Pain Management: Tylenol PO (pre-op)*   Induction: Intravenous  PONV Risk Score and Plan: 4 or greater and Treatment may vary due to age or medical condition, Ondansetron, Dexamethasone and Propofol infusion  Airway Management Planned: Oral ETT  Additional Equipment: None  Intra-op Plan:   Post-operative Plan: Extubation in OR  Informed Consent: I have reviewed the patients History and Physical, chart, labs and discussed the procedure including the risks, benefits and alternatives for the proposed anesthesia with the patient or authorized representative who has indicated his/her understanding and acceptance.     Dental advisory given  Plan Discussed with: CRNA and Anesthesiologist  Anesthesia Plan  Comments: ( )       Anesthesia Quick Evaluation

## 2022-02-03 NOTE — Progress Notes (Signed)
Anesthesia Chart Review:  Case: [id number] Date/Time: 02/06/22 1236   Procedure: SPINAL CORD STIMULATOR BATTERY EXCHANGE - 1 hr   Anesthesia type: General   Pre-op diagnosis: Painful spinal cord stimulator battery   [facility] ROOM 04 / [facility] OR   Surgeons: Melina Schools, MD       DISCUSSION: Patient is a 71 year old female scheduled for the above procedure.  History includes never smoker, post-operative N/V, HTN, murmur (mild MR/AR 08/10/17 echo), asthma, GERD (s/p gastric fundoplication), fatty liver, hypothyroidism, CVA (2020), dyspnea, syncope (history of), left breast cancer (s/p lumpectomy ~02/1999), osteoarthritis (right THA 12/29/15), spinal surgery (L4-5 laminectomy 09/11/17; spinal cord stimulator insertion 10/20/21).  Last nephrology visit with Durwin Reges, ACNP was on 01/30/22 for follow-up CKD stage 3a. Creatinine stable at 1, eGFR 60. One year follow-up recommended.   By notes, she is on Sinemet for restless legs.   Anesthesia team to evaluate on the day of surgery.   VS: BP 136/67   Pulse 65   Temp 36.8 C (Oral)   Resp 18   Ht _0  (1.575 m)   Wt 71.9 kg   SpO2 98%   BMI 28.99 kg/m    PROVIDERS: Lonia Mad, MD is listed as PCP. Per PAT documentation, PCP - [facility]- Mady Gemma, NP. Last visit 12/21/21. Dr. Alphonsa Gin is nephrologist [facility] [place])   LABS: Labs reviewed: Acceptable for surgery. (all labs ordered are listed, but only abnormal results are displayed)  Labs Reviewed  COMPREHENSIVE METABOLIC PANEL - Abnormal; Notable for the following components:      Result Value   Glucose, Bld 110 (*)    Creatinine, Ser 1.09 (*)    GFR, Estimated 54 (*)    All other components within normal limits  SURGICAL PCR SCREEN  CBC    IMAGES: Xray L-spine 10/20/21: IMPRESSION: Intraoperative fluoroscopy for spinal cord stimulator insertion.   EKG: 10/20/21:  Normal sinus rhythm Left axis deviation Nonspecific T  wave abnormality Abnormal ECG When compared with ECG of 10-Sep-2017 14:07, No significant change since last tracing Confirmed by Gwyndolyn Kaufman [id number]) on 10/20/2021 8:10:00 PM   CV: Echo 08/10/17 ([place]; scanned under Media tab, Correspondence Enc Date 09/11/17): Summary: 1.  Normal LV size with hyperdynamic function.  The ejection fraction is 75%.  Left ventricular filling pattern consistent with diastolic dysfunction, grade 1 impaired relaxation.  There is top normal LV wall thickness. 2.  Normal right ventricular size with normal function. 3.  The mitral valve is normal.  There is mild mitral regurgitation. 4.  There is mild aortic regurgitation. 5.  Small pericardial effusion.  Pericardial tamponade excluded.   Past Medical History:  Diagnosis Date   Allergy    Anxiety    Aortic atherosclerosis (HCC)    Asthma    Bipolar disorder ([place])    Breast cancer ([place])    Left   Carpal tunnel syndrome    Bilateral   Depression    Diarrhea    Dyspnea    Fatty liver    GERD (gastroesophageal reflux disease)    Grade I diastolic dysfunction    Heart murmur    History of stomach ulcers    Hypertension    Hypothyroidism    Insomnia    Low back pain    Lumbar radiculopathy, chronic    Migraines    Mild aortic regurgitation    MVP (mitral valve prolapse)    OA (osteoarthritis)    Pericardial effusion  Small   PONV (postoperative nausea and vomiting)    phenergan helps   Right hip pain    Seasonal allergies    Spinal stenosis    Spondylosis    Stroke (HCC)    no residual- (~August 2020)   Syncope    Vitamin B 12 deficiency     Past Surgical History:  Procedure Laterality Date   BREAST LUMPECTOMY     Left and reexcision   CHOLECYSTECTOMY     COLONOSCOPY     GASTRIC FUNDOPLICATION     HIP ARTHROPLASTY Right    LUMBAR LAMINECTOMY/DECOMPRESSION MICRODISCECTOMY N/A 09/11/2017   Procedure: Decompressive Lumbar Laminectomy L4-L5;  Surgeon: [name], MD;  Location: [facility];  Service: Orthopedics;  Laterality: N/A;   PARTIAL HYSTERECTOMY     SPINAL CORD STIMULATOR INSERTION N/A 10/20/2021   Procedure: SPINAL CORD STIMULATOR INSERTION;  Surgeon: Melina Schools, MD;  Location: [facility];  Service: Orthopedics;  Laterality: N/A;   UPPER GASTROINTESTINAL ENDOSCOPY     UPPER GI ENDOSCOPY      MEDICATIONS:  acetaminophen (TYLENOL) 500 MG tablet   Biotin w/ Vitamins C & E (HAIR SKIN & NAILS GUMMIES PO)   carbidopa-levodopa (SINEMET IR) 25-100 MG tablet   cetirizine (ZYRTEC) 10 MG tablet   Cholecalciferol (VITAMIN D-3 PO)   clonazePAM (KLONOPIN) 1 MG tablet   EPINEPHrine (PRIMATENE MIST IN)   Homeopathic Products (LIVER SUPPORT) SUBL   hydrOXYzine (ATARAX) 25 MG tablet   levothyroxine (SYNTHROID) 88 MCG tablet   losartan-hydrochlorothiazide (HYZAAR) 100-12.5 MG tablet   Multiple Vitamin (MULTIVITAMIN WITH MINERALS) TABS tablet   NON FORMULARY   olopatadine (PATANOL) 0.1 % ophthalmic solution   ondansetron (ZOFRAN) 4 MG tablet   OVER THE COUNTER MEDICATION   oxyCODONE-acetaminophen (PERCOCET) 10-325 MG tablet   potassium chloride SA (KLOR-CON M) 20 MEQ tablet   rosuvastatin (CRESTOR) 40 MG tablet   tiZANidine (ZANAFLEX) 2 MG tablet   venlafaxine XR (EFFEXOR-XR) 150 MG 24 hr capsule   zolpidem (AMBIEN CR) 12.5 MG CR tablet   No current facility-administered medications for this encounter.    Myra Gianotti, PA-C Surgical Short Stay/Anesthesiology [facility] Phone [phone] [facility] Phone [phone] 02/03/2022 11:33 AM

## 2022-02-06 ENCOUNTER — Ambulatory Visit (HOSPITAL_BASED_OUTPATIENT_CLINIC_OR_DEPARTMENT_OTHER): Payer: Medicare Other | Admitting: Anesthesiology

## 2022-02-06 ENCOUNTER — Other Ambulatory Visit: Payer: Self-pay

## 2022-02-06 ENCOUNTER — Encounter (HOSPITAL_COMMUNITY): Payer: Self-pay | Admitting: Orthopedic Surgery

## 2022-02-06 ENCOUNTER — Ambulatory Visit (HOSPITAL_COMMUNITY): Payer: Medicare Other | Admitting: Vascular Surgery

## 2022-02-06 ENCOUNTER — Encounter (HOSPITAL_COMMUNITY)
Admission: RE | Disposition: A | Discharge status: Home / Self Care | Payer: Self-pay | Source: Home / Self Care | Attending: Orthopedic Surgery

## 2022-02-06 ENCOUNTER — Ambulatory Visit (HOSPITAL_COMMUNITY)
Admission: RE | Admit: 2022-02-06 | Discharge: 2022-02-06 | Disposition: A | Discharge status: Home / Self Care | Payer: Medicare Other | Attending: Orthopedic Surgery | Admitting: Orthopedic Surgery

## 2022-02-06 DIAGNOSIS — F319 Bipolar disorder, unspecified: Secondary | ICD-10-CM | POA: Diagnosis not present

## 2022-02-06 DIAGNOSIS — J45909 Unspecified asthma, uncomplicated: Secondary | ICD-10-CM

## 2022-02-06 DIAGNOSIS — Z880 Allergy status to penicillin: Secondary | ICD-10-CM | POA: Diagnosis not present

## 2022-02-06 DIAGNOSIS — I10 Essential (primary) hypertension: Secondary | ICD-10-CM

## 2022-02-06 DIAGNOSIS — Y753 Surgical instruments, materials and neurological devices (including sutures) associated with adverse incidents: Secondary | ICD-10-CM | POA: Insufficient documentation

## 2022-02-06 DIAGNOSIS — T85840A Pain due to nervous system prosthetic devices, implants and grafts, initial encounter: Secondary | ICD-10-CM | POA: Insufficient documentation

## 2022-02-06 DIAGNOSIS — G709 Myoneural disorder, unspecified: Secondary | ICD-10-CM | POA: Diagnosis not present

## 2022-02-06 DIAGNOSIS — E039 Hypothyroidism, unspecified: Secondary | ICD-10-CM | POA: Insufficient documentation

## 2022-02-06 DIAGNOSIS — F418 Other specified anxiety disorders: Secondary | ICD-10-CM | POA: Insufficient documentation

## 2022-02-06 DIAGNOSIS — M1990 Unspecified osteoarthritis, unspecified site: Secondary | ICD-10-CM | POA: Insufficient documentation

## 2022-02-06 DIAGNOSIS — T85695A Other mechanical complication of other nervous system device, implant or graft, initial encounter: Secondary | ICD-10-CM

## 2022-02-06 HISTORY — PX: SPINAL CORD STIMULATOR BATTERY EXCHANGE: SHX6202

## 2022-02-06 SURGERY — SPINAL CORD STIMULATOR BATTERY EXCHANGE
Anesthesia: General

## 2022-02-06 MED ORDER — BUPIVACAINE-EPINEPHRINE 0.25% -1:200000 IJ SOLN
INTRAMUSCULAR | Status: DC | PRN
  Administered 2022-02-06: 10 mL
  Administered 2022-02-06: 20 mL

## 2022-02-06 MED ORDER — LIDOCAINE 2% (20 MG/ML) 5 ML SYRINGE
INTRAMUSCULAR | Status: DC | PRN
  Administered 2022-02-06: 60 mg via INTRAVENOUS

## 2022-02-06 MED ORDER — MIDAZOLAM HCL 2 MG/2ML IJ SOLN
INTRAMUSCULAR | Status: AC
  Filled 2022-02-06: qty 2

## 2022-02-06 MED ORDER — CHLORHEXIDINE GLUCONATE 0.12 % MT SOLN
15.0000 mL | Freq: Once | OROMUCOSAL | Status: AC
  Administered 2022-02-06: 15 mL via OROMUCOSAL
  Filled 2022-02-06: qty 15

## 2022-02-06 MED ORDER — LACTATED RINGERS IV SOLN: INTRAVENOUS | Status: DC

## 2022-02-06 MED ORDER — FENTANYL CITRATE (PF) 250 MCG/5ML IJ SOLN
INTRAMUSCULAR | Status: DC | PRN
  Administered 2022-02-06 (×3): 50 ug via INTRAVENOUS

## 2022-02-06 MED ORDER — FENTANYL CITRATE (PF) 100 MCG/2ML IJ SOLN: 25.0000 ug | INTRAMUSCULAR | Status: DC | PRN

## 2022-02-06 MED ORDER — PROPOFOL 10 MG/ML IV BOLUS
INTRAVENOUS | Status: DC | PRN
  Administered 2022-02-06: 140 mg via INTRAVENOUS

## 2022-02-06 MED ORDER — BUPIVACAINE-EPINEPHRINE (PF) 0.5% -1:200000 IJ SOLN
INTRAMUSCULAR | Status: AC
  Filled 2022-02-06: qty 30

## 2022-02-06 MED ORDER — BUPIVACAINE LIPOSOME 1.3 % IJ SUSP
INTRAMUSCULAR | Status: AC
  Filled 2022-02-06: qty 20

## 2022-02-06 MED ORDER — ACETAMINOPHEN 500 MG PO TABS
1000.0000 mg | ORAL_TABLET | Freq: Once | ORAL | Status: AC
  Administered 2022-02-06: 1000 mg via ORAL
  Filled 2022-02-06: qty 2

## 2022-02-06 MED ORDER — BUPIVACAINE LIPOSOME 1.3 % IJ SUSP
INTRAMUSCULAR | Status: DC | PRN
  Administered 2022-02-06: 20 mL

## 2022-02-06 MED ORDER — PROPOFOL 500 MG/50ML IV EMUL
INTRAVENOUS | Status: DC | PRN
  Administered 2022-02-06: 50 ug/kg/min via INTRAVENOUS

## 2022-02-06 MED ORDER — ROCURONIUM BROMIDE 10 MG/ML (PF) SYRINGE
PREFILLED_SYRINGE | INTRAVENOUS | Status: DC | PRN
  Administered 2022-02-06: 50 mg via INTRAVENOUS

## 2022-02-06 MED ORDER — DIPHENHYDRAMINE HCL 50 MG/ML IJ SOLN
INTRAMUSCULAR | Status: DC | PRN
  Administered 2022-02-06: 12.5 mg via INTRAVENOUS

## 2022-02-06 MED ORDER — DEXAMETHASONE SODIUM PHOSPHATE 10 MG/ML IJ SOLN
INTRAMUSCULAR | Status: DC | PRN
  Administered 2022-02-06: 10 mg via INTRAVENOUS

## 2022-02-06 MED ORDER — ONDANSETRON HCL 4 MG/2ML IJ SOLN
INTRAMUSCULAR | Status: DC | PRN
  Administered 2022-02-06: 4 mg via INTRAVENOUS

## 2022-02-06 MED ORDER — FENTANYL CITRATE (PF) 250 MCG/5ML IJ SOLN
INTRAMUSCULAR | Status: AC
  Filled 2022-02-06: qty 5

## 2022-02-06 MED ORDER — SUGAMMADEX SODIUM 200 MG/2ML IV SOLN
INTRAVENOUS | Status: DC | PRN
  Administered 2022-02-06: 200 mg via INTRAVENOUS

## 2022-02-06 MED ORDER — PHENYLEPHRINE 80 MCG/ML (10ML) SYRINGE FOR IV PUSH (FOR BLOOD PRESSURE SUPPORT)
PREFILLED_SYRINGE | INTRAVENOUS | Status: DC | PRN
  Administered 2022-02-06 (×2): 80 ug via INTRAVENOUS

## 2022-02-06 MED ORDER — BUPIVACAINE-EPINEPHRINE (PF) 0.25% -1:200000 IJ SOLN
INTRAMUSCULAR | Status: AC
  Filled 2022-02-06: qty 30

## 2022-02-06 MED ORDER — EPHEDRINE SULFATE-NACL 50-0.9 MG/10ML-% IV SOSY
PREFILLED_SYRINGE | INTRAVENOUS | Status: DC | PRN
  Administered 2022-02-06: 5 mg via INTRAVENOUS
  Administered 2022-02-06 (×2): 10 mg via INTRAVENOUS

## 2022-02-06 MED ORDER — 0.9 % SODIUM CHLORIDE (POUR BTL) OPTIME
TOPICAL | Status: DC | PRN
  Administered 2022-02-06: 1000 mL

## 2022-02-06 MED ORDER — ORAL CARE MOUTH RINSE: 15.0000 mL | Freq: Once | OROMUCOSAL | Status: AC

## 2022-02-06 MED ORDER — PROPOFOL 10 MG/ML IV BOLUS
INTRAVENOUS | Status: AC
  Filled 2022-02-06: qty 20

## 2022-02-06 MED ORDER — PROMETHAZINE HCL 25 MG/ML IJ SOLN
INTRAMUSCULAR | Status: AC
  Administered 2022-02-06: 12.5 mg via INTRAVENOUS
  Filled 2022-02-06: qty 1

## 2022-02-06 MED ORDER — CEFAZOLIN SODIUM-DEXTROSE 2-4 GM/100ML-% IV SOLN
2.0000 g | INTRAVENOUS | Status: AC
  Administered 2022-02-06: 2 g via INTRAVENOUS
  Filled 2022-02-06: qty 100

## 2022-02-06 MED ORDER — PROMETHAZINE HCL 25 MG/ML IJ SOLN: 12.5000 mg | Freq: Once | INTRAMUSCULAR | Status: AC | PRN

## 2022-02-06 SURGICAL SUPPLY — 51 items
BAG COUNTER SPONGE SURGICOUNT (BAG) ×1 IMPLANT
CANISTER SUCT 3000ML PPV (MISCELLANEOUS) ×1 IMPLANT
CHARGER BATTERY NEUROSTIM ABT (NEUROSURGERY SUPPLIES) IMPLANT
CLSR STERI-STRIP ANTIMIC 1/2X4 (GAUZE/BANDAGES/DRESSINGS) ×1 IMPLANT
CONTROLLER NEUROSTIM PATIENT (NEUROSURGERY SUPPLIES) IMPLANT
CORD BIPOLAR FORCEPS 12FT (ELECTRODE) ×1 IMPLANT
DRAIN CHANNEL 15F RND FF W/TCR (WOUND CARE) IMPLANT
DRAPE INCISE IOBAN 66X45 STRL (DRAPES) ×1 IMPLANT
DRAPE LAPAROTOMY 100X72 PEDS (DRAPES) ×1 IMPLANT
DRAPE POUCH INSTRU U-SHP 10X18 (DRAPES) ×1 IMPLANT
DRAPE SURG 17X23 STRL (DRAPES) ×1 IMPLANT
DRAPE U-SHAPE 47X51 STRL (DRAPES) ×1 IMPLANT
DRSG AQUACEL AG ADV 3.5X 6 (GAUZE/BANDAGES/DRESSINGS) ×1 IMPLANT
DRSG OPSITE POSTOP 4X6 (GAUZE/BANDAGES/DRESSINGS) ×1 IMPLANT
DURAPREP 26ML APPLICATOR (WOUND CARE) ×1 IMPLANT
ELECT CAUTERY BLADE 6.4 (BLADE) ×1 IMPLANT
ELECT PENCIL ROCKER SW 15FT (MISCELLANEOUS) ×1 IMPLANT
ELECT REM PT RETURN 9FT ADLT (ELECTROSURGICAL) ×1
ELECTRODE REM PT RTRN 9FT ADLT (ELECTROSURGICAL) ×1 IMPLANT
GENERATOR NEUROSTIM ETERNA 16 (Generator) IMPLANT
GLOVE BIO SURGEON STRL SZ 6.5 (GLOVE) ×1 IMPLANT
GLOVE BIOGEL PI IND STRL 6.5 (GLOVE) ×1 IMPLANT
GLOVE BIOGEL PI IND STRL 8.5 (GLOVE) ×1 IMPLANT
GLOVE SS BIOGEL STRL SZ 8.5 (GLOVE) ×2 IMPLANT
GOWN STRL REUS W/ TWL LRG LVL3 (GOWN DISPOSABLE) ×2 IMPLANT
GOWN STRL REUS W/TWL 2XL LVL3 (GOWN DISPOSABLE) ×1 IMPLANT
GOWN STRL REUS W/TWL LRG LVL3 (GOWN DISPOSABLE) ×2
KIT BASIN OR (CUSTOM PROCEDURE TRAY) ×1 IMPLANT
KIT CHARGER NEUROSTIM ABT (NEUROSURGERY SUPPLIES) IMPLANT
KIT TURNOVER KIT B (KITS) ×1 IMPLANT
MANUAL PATIENT NEUROSTIM DISP (MISCELLANEOUS) IMPLANT
NDL 22X1.5 STRL (OR ONLY) (MISCELLANEOUS) IMPLANT
NDL SUT 6 .5 CRC .975X.05 MAYO (NEEDLE) ×1 IMPLANT
NEEDLE 22X1.5 STRL (OR ONLY) (MISCELLANEOUS) IMPLANT
NEEDLE MAYO TAPER (NEEDLE)
NS IRRIG 1000ML POUR BTL (IV SOLUTION) ×1 IMPLANT
PACK GENERAL/GYN (CUSTOM PROCEDURE TRAY) ×1 IMPLANT
PACK UNIVERSAL I (CUSTOM PROCEDURE TRAY) ×1 IMPLANT
PAD ARMBOARD 7.5X6 YLW CONV (MISCELLANEOUS) ×2 IMPLANT
SPONGE T-LAP 4X18 ~~LOC~~+RFID (SPONGE) ×1 IMPLANT
STAPLER VISISTAT 35W (STAPLE) IMPLANT
SUT ETHIBOND NAB CT1 #1 30IN (SUTURE) ×2 IMPLANT
SUT MNCRL AB 3-0 PS2 27 (SUTURE) ×2 IMPLANT
SUT VIC AB 1 CT1 18XCR BRD 8 (SUTURE) ×1 IMPLANT
SUT VIC AB 1 CT1 8-18 (SUTURE) ×1
SUT VIC AB 2-0 CT1 18 (SUTURE) ×2 IMPLANT
SYR BULB IRRIG 60ML STRL (SYRINGE) ×1 IMPLANT
SYR CONTROL 10ML LL (SYRINGE) IMPLANT
TOWEL GREEN STERILE (TOWEL DISPOSABLE) ×1 IMPLANT
TOWEL GREEN STERILE FF (TOWEL DISPOSABLE) ×2 IMPLANT
WATER STERILE IRR 1000ML POUR (IV SOLUTION) ×1 IMPLANT

## 2022-02-06 NOTE — H&P (Signed)
History:  Jillian Reid returns today postop with complaints of severe gluteal pain at the site of the battery. She notes that her preoperative back buttock and neuropathic leg pain has significantly improved with the spinal cord stimulator. Unfortunately the battery site pain has significantly affected her quality of life.  At this point we will plan on replacing the battery.  Past Medical History:  Diagnosis Date   Allergy    Anxiety    Aortic atherosclerosis (HCC)    Asthma    Bipolar disorder ([place])    Breast cancer ([place])    Left   Carpal tunnel syndrome    Bilateral   Depression    Diarrhea    Dyspnea    Fatty liver    GERD (gastroesophageal reflux disease)    Grade I diastolic dysfunction    Heart murmur    History of stomach ulcers    Hypertension    Hypothyroidism    Insomnia    Low back pain    Lumbar radiculopathy, chronic    Migraines    Mild aortic regurgitation    MVP (mitral valve prolapse)    OA (osteoarthritis)    Pericardial effusion    Small   PONV (postoperative nausea and vomiting)    phenergan helps   Right hip pain    Seasonal allergies    Spinal stenosis    Spondylosis    Stroke (HCC)    no residual- (~August 2020)   Syncope    Vitamin B 12 deficiency     Allergies  Allergen Reactions   Codeine Nausea And Vomiting    Tolerates hydrocodone    Penicillins Hives, Itching and Other (See Comments)    Has patient had a PCN reaction causing immediate rash, facial/tongue/throat swelling, SOB or lightheadedness with hypotension: No Has patient had a PCN reaction causing severe rash involving mucus membranes or skin necrosis: No Has patient had a PCN reaction that required hospitalization: No Has patient had a PCN reaction occurring within the last 10 years: No If all of the above answers are "NO", then may proceed with Cephalosporin use.    Oxycodone Rash    No current facility-administered medications on file prior to encounter.   Current  Outpatient Medications on File Prior to Encounter  Medication Sig Dispense Refill   acetaminophen (TYLENOL) 500 MG tablet Take 500 mg by mouth daily as needed for moderate pain.     Biotin w/ Vitamins C & E (HAIR SKIN & NAILS GUMMIES PO) Take 1 tablet by mouth daily.     carbidopa-levodopa (SINEMET IR) 25-100 MG tablet Take 1 tablet by mouth See admin instructions. Take 1 tablet at night, may take a second dose during the day as needed for restless legs     cetirizine (ZYRTEC) 10 MG tablet Take 10 mg by mouth at bedtime.     Cholecalciferol (VITAMIN D-3 PO) Take 1 capsule by mouth daily.     clonazePAM (KLONOPIN) 1 MG tablet Take 1 mg by mouth at bedtime.     EPINEPHrine (PRIMATENE MIST IN) Inhale 1 puff into the lungs daily as needed (Shortness of breath).     Homeopathic Products (LIVER SUPPORT) SUBL Place 2 tablets under the tongue daily.     hydrOXYzine (ATARAX) 25 MG tablet Take 25 mg by mouth 3 (three) times daily as needed for itching.     levothyroxine (SYNTHROID) 88 MCG tablet Take 88 mcg by mouth daily before breakfast.     losartan-hydrochlorothiazide (HYZAAR)  100-12.5 MG tablet Take 1 tablet by mouth daily.     Multiple Vitamin (MULTIVITAMIN WITH MINERALS) TABS tablet Take 2 tablets by mouth daily.     NON FORMULARY Place 1 drop into both eyes daily as needed (dry eyes). Optase dry eye relief     olopatadine (PATANOL) 0.1 % ophthalmic solution Place 1 drop into both eyes daily as needed (Itching eyes).     OVER THE COUNTER MEDICATION Take 1 capsule by mouth daily. Eye promise otc supplement     oxyCODONE-acetaminophen (PERCOCET) 10-325 MG tablet Take 1 tablet by mouth 3 (three) times daily as needed for pain.     potassium chloride SA (KLOR-CON M) 20 MEQ tablet Take 20 mEq by mouth daily.     rosuvastatin (CRESTOR) 40 MG tablet Take 40 mg by mouth at bedtime.     tiZANidine (ZANAFLEX) 2 MG tablet Take 4 mg by mouth every 8 (eight) hours as needed for muscle spasms.     venlafaxine XR  (EFFEXOR-XR) 150 MG 24 hr capsule Take 150 mg by mouth at bedtime.     zolpidem (AMBIEN CR) 12.5 MG CR tablet Take 12.5 mg by mouth at bedtime.     ondansetron (ZOFRAN) 4 MG tablet Take 1 tablet (4 mg total) by mouth every 8 (eight) hours as needed for nausea or vomiting. (Patient not taking: Reported on 01/27/2022) 20 tablet 0    Physical Exam: Vitals:   02/06/22 1028  BP: (!) 105/95  Pulse: 76  Resp: 17  Temp: 98.3 F (36.8 C)  SpO2: 95%   Body mass index is 29.26 kg/m. Clinical exam: Jillian Reid is a pleasant individual, who appears younger than their stated age.   She is alert and orientated 3.   No shortness of breath, chest pain.   Abdomen is soft and non-tender, negative loss of bowel and bladder control, no rebound tenderness.   Negative: skin lesions abrasions contusions  Peripheral pulses: 2+ peripheral pulses bilaterally.  LE compartments are: Soft and nontender.  Gait pattern: normal  Assistive devices: None  Neuro: Negative Babinski test, no clonus, negative straight leg raise test. 5/5 motor strength in the lower extremity bilaterally. Positive numbness and dysesthesias into both lower extremities. 1+ deep tendon reflexes at the knee absent at the Achilles.  Musculoskeletal: mild back pain with palpation and range of motion. Has significant pain with palpation over the spinal cord stimulator battery site.  Imaging: x-rays of the lumbar spine demonstrate normal sagittal alignment. Mild multilevel degenerative disc disease but no scoliosis or spondylolisthesis.   A/P: Summary: Jillian Reid returns today postop with complaints of severe gluteal pain at the site of the battery. She notes that her preoperative back buttock and neuropathic leg pain has significantly improved with the spinal cord stimulator. Unfortunately the battery site pain has significantly affected her quality of life.  Patient has had progressive depression and anxiety which is making her asthma more  prominent. She had her ability to perform activities of daily living because of the severe localized pain has been compromised. Although there are no signs of infection it is clear that she is having significant problems at the battery site. The battery is easily palpated and is prominent which may be the source of her problem.  At this point in time I do believe since the spinal cord stimulator itself is functioning that it is worth replacing the battery. I have spoken with the [employer] spine rep and that there is a much smaller battery that can be  placed. This can be positioned in the same incision and will not be as prominent. I have reviewed the risks, benefits, and alternatives of surgery with the patient and all of her questions were addressed. We will move forward with surgery in a timely fashion.  Risks and benefits of surgery were discussed with the patient. These include: Infection, bleeding, death, stroke, paralysis, ongoing or worse pain, need for additional surgery, Failure of the battery requiring reoperation. Damage injury to the leads requiring the surgery to be aborted. Migration of the lead, failure to obtain similar results.

## 2022-02-06 NOTE — Discharge Instructions (Signed)
Spinal Cord Stimulator Replacement Care After This sheet gives you information about how to care for yourself after your procedure. Your health care provider may also give you more specific instructions. If you have problems or questions, contact your health care provider. What can I expect after the procedure? After the procedure, it is common to have: Soreness or pain. Some swelling in the area where the hardware was removed. A small amount of blood or clear fluid coming from your incision. Follow these instructions at home: If you have a cast: Do not stick anything inside the cast to scratch your skin. Doing that increases your risk of infection. Check the skin around the cast every day. Tell your health care provider about any concerns. You may put lotion on dry skin around the edges of the cast. Do not put lotion on the skin underneath the cast. Keep the cast clean and dry. Do not take baths, swim, or use a hot tub until your health care provider approves. Ask your health care provider if you may take showers. You may only be allowed to take sponge baths. Keep the bandage (dressing) dry until your health care provider says it can be removed. Ok to shower in 5 days.    Incision care  Follow instructions from your health care provider about how to take care of your incision. Make sure you: Wash your hands with soap and water before you change your dressing. If soap and water are not available, use hand sanitizer. Change your dressing as told by your health care provider. Leave stitches (sutures), skin glue, or adhesive strips in place. These skin closures may need to stay in place for 2 weeks or longer. If adhesive strip edges start to loosen and curl up, you may trim the loose edges. Do not remove adhesive strips completely unless your health care provider tells you to do that. Check your incision area every day for signs of infection. Check for: Redness. More swelling or pain. More  fluid or blood. Warmth. Pus or a bad smell. Managing pain, stiffness, and swelling  If directed, put ice on the affected area: Put ice in a plastic bag. Place a towel between your skin and the bag. Leave the ice on for 20 minutes, 2-3 times a day. Move your fingers or toes often to avoid stiffness and to lessen swelling.  Driving Do not drive or use heavy machinery while taking prescription pain medicine. Do not drive for 24 hours if you were given a medicine to help you relax (sedative) during your procedure. Ask your health care provider when it is safe to drive if you have a cast, splint, or boot on the affected limb. Activity Ask your health care provider what activities are safe for you during recovery, and ask what activities you need to avoid. Do not use the injured limb to support your body weight until your health care provider says that you can. Do not play contact sports until your health care provider approves. Do exercises as told by your health care provider. Avoid sitting for a long time without moving. Get up and move around at least every few hours. This will help prevent blood clots. General instructions Do not put pressure on any part of the cast or splint until it is fully hardened. This may take several hours. If you are taking prescription pain medicine, take actions to prevent or treat constipation. Your health care provider may recommend that you: Drink enough fluid to keep your  urine pale yellow. Eat foods that are high in fiber, such as fresh fruits and vegetables, whole grains, and beans. Limit foods that are high in fat and processed sugars, such as fried or sweet foods. Take an over-the-counter or prescription medicine for constipation. Do not use any products that contain nicotine or tobacco, such as cigarettes and e-cigarettes. These can delay bone healing after surgery. If you need help quitting, ask your health care provider. Take over-the-counter and  prescription medicines only as told by your health care provider. Keep all follow-up visits as told by your health care provider. This is important. Contact a health care provider if: You have lasting pain. You have redness around your incision. You have more swelling or pain around your incision. You have more fluid or blood coming from your incision. Your incision feels warm to the touch. You have pus or a bad smell coming from your incision. You are unable to do exercises or physical activity as told by your health care provider. Get help right away if: You have difficulty breathing. You have chest pain. You have severe pain. You have a fever or chills. You have numbness for more than 24 hours in the area where the hardware was removed. Summary After the procedure, it is common to have some pain and swelling in the area where the hardware was removed. Follow instructions from your health care provider about how to take care of your incision. Return to your normal activities as told by your health care provider. Ask your health care provider what activities are safe for you. This information is not intended to replace advice given to you by your health care provider. Make sure you discuss any questions you have with your health care provider.

## 2022-02-06 NOTE — Brief Op Note (Signed)
02/06/2022  1:34 PM  PATIENT:  Jillian Reid  71 y.o. female  PRE-OPERATIVE DIAGNOSIS:  Painful spinal cord stimulator battery  POST-OPERATIVE DIAGNOSIS:  Painful spinal cord stimulator battery  PROCEDURE:  Procedure(s) with comments: SPINAL CORD STIMULATOR BATTERY EXCHANGE (N/A) - 1 hr  SURGEON:  Surgeon(s) and Role:    Melina Schools, MD - Primary  PHYSICIAN ASSISTANT:   ASSISTANTS: none   ANESTHESIA:   general  EBL:  5 mL   BLOOD ADMINISTERED:none  DRAINS: none   LOCAL MEDICATIONS USED:  MARCAINE and Exparel  SPECIMEN:  No Specimen  DISPOSITION OF SPECIMEN:  N/A  COUNTS:  YES  TOURNIQUET:  * No tourniquets in log *  DICTATION: .Dragon Dictation  PLAN OF CARE: Discharge to home after PACU  PATIENT DISPOSITION:  PACU - hemodynamically stable.

## 2022-02-06 NOTE — Op Note (Signed)
OPERATIVE REPORT  DATE OF SURGERY: 02/06/2022  PATIENT NAME:  Jillian Reid MRN: [id number] DOB: Apr 16, 1950  PCP: Lonia Mad, MD  PRE-OPERATIVE DIAGNOSIS: Painful spinal cord stimulator battery  POST-OPERATIVE DIAGNOSIS: Same  PROCEDURE:   Replace spinal cord stimulator battery  SURGEON:  Melina Schools, MD  PHYSICIAN ASSISTANT: None  ANESTHESIA:   General  EBL: Minimal   Complications: None  Implants: Removed proclaim XR battery.  Implanted the Eterna pulse generator  BRIEF HISTORY: [name] is a 71 y.o. female who had a spinal cord stimulator placed and unfortunately was having severe pain due to the size of the battery.  She noted significant pain and inability to sleep.  She has significant discomfort and loss in quality of life due to the size of the battery.  As result we elected to take her back to the operating room to explant the proclaim XR battery and implant the smaller Eterna pulse generator.  All appropriate risks, benefits, alternatives to surgery were discussed and consent was obtained.  PROCEDURE DETAILS: Patient was brought into the operating room and was properly positioned on the operating room table.  After induction with general anesthesia the patient was endotracheally intubated.  A timeout was taken to confirm all important data: including patient, procedure, and the level. Teds, SCD's were applied.   Patient was turned prone onto the Wilson frame and all bony prominences were well-padded.  The back was then prepped and draped in a standard fashion.  I infiltrated the incision site with quarter percent Marcaine with epinephrine and then we incised the right gluteal incision with the battery was.  I sharply dissected down to the battery and then exposed capsule and incised it.  The battery was then removed and the leads free.  The new battery was then obtained and the new leads after drying them off were inserted and tightened according manufacture  standards.  The battery was then placed back into the battery site and the excess lead was packed on the undersurface.  The battery was then secured to the deep fascia with two #1 Vicryl sutures.  At this point the battery was tested by the representative and was noted to be functioning appropriately.  The wound was now copiously irrigated with normal saline and the deep fascia was closed with interrupted #1 Vicryl sutures.  I then closed the wound in a layered fashion with interrupted 2-0 Vicryl sutures and 3-0 Monocryl for the skin.  Steri-Strips and dry dressings were applied and the patient was ultimately extubated transfer the PACU without incident.  The end of the case all needle sponge counts were correct.  There were no adverse intraoperative events.  Melina Schools, MD 02/06/2022 1:30 PM

## 2022-02-06 NOTE — Anesthesia Procedure Notes (Signed)
Procedure Name: Intubation Date/Time: 02/06/2022 12:30 PM  Performed by: Dorthea Cove, CRNAPre-anesthesia Checklist: Patient identified, Emergency Drugs available, Suction available and Patient being monitored Patient Re-evaluated:Patient Re-evaluated prior to induction Oxygen Delivery Method: Circle system utilized Preoxygenation: Pre-oxygenation with 100% oxygen Induction Type: IV induction Ventilation: Mask ventilation without difficulty Laryngoscope Size: Mac and 3 Grade View: Grade I Tube type: Oral Tube size: 7.0 mm Number of attempts: 1 Airway Equipment and Method: Stylet and Oral airway Placement Confirmation: ETT inserted through vocal cords under direct vision, positive ETCO2 and breath sounds checked- equal and bilateral Secured at: 20 cm Tube secured with: Tape Dental Injury: Teeth and Oropharynx as per pre-operative assessment

## 2022-02-06 NOTE — Transfer of Care (Signed)
Immediate Anesthesia Transfer of Care Note  Patient: Chenika Nevils  Procedure(s) Performed: SPINAL CORD STIMULATOR BATTERY EXCHANGE  Patient Location: PACU  Anesthesia Type:General  Level of Consciousness: awake, alert , oriented, and patient cooperative  Airway & Oxygen Therapy: Patient Spontanous Breathing and Patient connected to nasal cannula oxygen  Post-op Assessment: Report given to RN and Post -op Vital signs reviewed and stable  Post vital signs: Reviewed and stable  Last Vitals:  Vitals Value Taken Time  BP 130/63 02/06/22 1323  Temp    Pulse 81 02/06/22 1324  Resp 17 02/06/22 1324  SpO2 94 % 02/06/22 1324  Vitals shown include unvalidated device data.  Last Pain:  Vitals:   02/06/22 1036  TempSrc:   PainSc: 0-No pain      Patients Stated Pain Goal: 0 (50/27/74 1287)  Complications: No notable events documented.

## 2022-02-07 ENCOUNTER — Encounter (HOSPITAL_COMMUNITY): Payer: Self-pay | Admitting: Orthopedic Surgery

## 2022-02-07 NOTE — Anesthesia Postprocedure Evaluation (Signed)
Anesthesia Post Note  Patient: Jillian Reid  Procedure(s) Performed: SPINAL CORD STIMULATOR BATTERY EXCHANGE     Patient location during evaluation: PACU Anesthesia Type: General Level of consciousness: awake and alert Pain management: pain level controlled Vital Signs Assessment: post-procedure vital signs reviewed and stable Respiratory status: spontaneous breathing, nonlabored ventilation and respiratory function stable Cardiovascular status: stable and blood pressure returned to baseline Anesthetic complications: no   No notable events documented.  Last Vitals:  Vitals:   02/06/22 1345 02/06/22 1415  BP: (!) 146/68 (!) 148/71  Pulse: 73 71  Resp: 11   Temp:  36.7 C  SpO2: 96% 92%    Last Pain:  Vitals:   02/06/22 1415  TempSrc:   PainSc: 0-No pain                 Audry Pili
